# Patient Record
Sex: Female | Born: 1935 | Race: White | Hispanic: No | Marital: Married
Health system: Midwestern US, Community
[De-identification: ages and names within clinical notes are randomized; demographics above are authoritative.]

## PROBLEM LIST (undated history)

## (undated) DIAGNOSIS — M3322 Polymyositis with myopathy: Secondary | ICD-10-CM

## (undated) HISTORY — PX: THROAT SURGERY: SHX803

## (undated) HISTORY — PX: EYE SURGERY: SHX253

---

## 2014-06-26 ENCOUNTER — Inpatient Hospital Stay (HOSPITAL_COMMUNITY)
Admission: EM | Admit: 2014-06-26 | Discharge: 2014-06-30 | DRG: 480 | Disposition: A | Discharge status: Home Health | Payer: Medicare HMO | Attending: Internal Medicine | Admitting: Internal Medicine

## 2014-06-26 ENCOUNTER — Emergency Department (HOSPITAL_COMMUNITY): Payer: Medicare HMO

## 2014-06-26 ENCOUNTER — Encounter (HOSPITAL_COMMUNITY): Payer: Self-pay | Admitting: Cardiology

## 2014-06-26 DIAGNOSIS — E876 Hypokalemia: Secondary | ICD-10-CM | POA: Diagnosis not present

## 2014-06-26 DIAGNOSIS — Y9259 Other trade areas as the place of occurrence of the external cause: Secondary | ICD-10-CM | POA: Diagnosis not present

## 2014-06-26 DIAGNOSIS — Z7982 Long term (current) use of aspirin: Secondary | ICD-10-CM | POA: Diagnosis not present

## 2014-06-26 DIAGNOSIS — S72141A Displaced intertrochanteric fracture of right femur, initial encounter for closed fracture: Secondary | ICD-10-CM | POA: Diagnosis present

## 2014-06-26 DIAGNOSIS — W1809XA Striking against other object with subsequent fall, initial encounter: Secondary | ICD-10-CM | POA: Diagnosis present

## 2014-06-26 DIAGNOSIS — S0001XA Abrasion of scalp, initial encounter: Secondary | ICD-10-CM | POA: Diagnosis present

## 2014-06-26 DIAGNOSIS — E43 Unspecified severe protein-calorie malnutrition: Secondary | ICD-10-CM | POA: Diagnosis present

## 2014-06-26 DIAGNOSIS — S72001A Fracture of unspecified part of neck of right femur, initial encounter for closed fracture: Secondary | ICD-10-CM

## 2014-06-26 DIAGNOSIS — R52 Pain, unspecified: Secondary | ICD-10-CM

## 2014-06-26 DIAGNOSIS — Z419 Encounter for procedure for purposes other than remedying health state, unspecified: Secondary | ICD-10-CM

## 2014-06-26 DIAGNOSIS — M3320 Polymyositis, organ involvement unspecified: Secondary | ICD-10-CM | POA: Diagnosis present

## 2014-06-26 DIAGNOSIS — W19XXXA Unspecified fall, initial encounter: Secondary | ICD-10-CM

## 2014-06-26 DIAGNOSIS — Z681 Body mass index (BMI) 19 or less, adult: Secondary | ICD-10-CM | POA: Diagnosis not present

## 2014-06-26 DIAGNOSIS — M25551 Pain in right hip: Secondary | ICD-10-CM | POA: Diagnosis present

## 2014-06-26 HISTORY — DX: Polymyositis with myopathy: M33.22

## 2014-06-26 LAB — CBC WITH DIFFERENTIAL/PLATELET
BASOS PCT: 1 % (ref 0–1)
Basophils Absolute: 0 10*3/uL (ref 0.0–0.1)
EOS ABS: 0 10*3/uL (ref 0.0–0.7)
EOS PCT: 1 % (ref 0–5)
HEMATOCRIT: 39.5 % (ref 36.0–46.0)
HEMOGLOBIN: 13.4 g/dL (ref 12.0–15.0)
LYMPHS ABS: 0.7 10*3/uL (ref 0.7–4.0)
Lymphocytes Relative: 16 % (ref 12–46)
MCH: 32.1 pg (ref 26.0–34.0)
MCHC: 33.9 g/dL (ref 30.0–36.0)
MCV: 94.5 fL (ref 78.0–100.0)
MONO ABS: 0.3 10*3/uL (ref 0.1–1.0)
MONOS PCT: 6 % (ref 3–12)
Neutro Abs: 3.4 10*3/uL (ref 1.7–7.7)
Neutrophils Relative %: 76 % (ref 43–77)
Platelets: ADEQUATE 10*3/uL (ref 150–400)
RBC: 4.18 MIL/uL (ref 3.87–5.11)
RDW: 12.9 % (ref 11.5–15.5)
WBC: 4.4 10*3/uL (ref 4.0–10.5)

## 2014-06-26 LAB — BASIC METABOLIC PANEL
Anion gap: 14 (ref 5–15)
BUN: 12 mg/dL (ref 6–23)
CHLORIDE: 102 meq/L (ref 96–112)
CO2: 28 mEq/L (ref 19–32)
CREATININE: 0.36 mg/dL — AB (ref 0.50–1.10)
Calcium: 9.3 mg/dL (ref 8.4–10.5)
GFR calc Af Amer: >90 mL/min (ref >90)
GFR calc non Af Amer: >90 mL/min (ref >90)
GLUCOSE: 86 mg/dL (ref 70–99)
POTASSIUM: 3.6 meq/L — AB (ref 3.7–5.3)
Sodium: 144 mEq/L (ref 137–147)

## 2014-06-26 LAB — SURGICAL PCR SCREEN
MRSA, PCR: NEGATIVE
Staphylococcus aureus: NEGATIVE

## 2014-06-26 LAB — PROTIME-INR
INR: 1.18 (ref 0.00–1.49)
Prothrombin Time: 15.1 seconds (ref 11.6–15.2)

## 2014-06-26 MED ORDER — ACETAMINOPHEN 325 MG PO TABS: 650.0000 mg | ORAL_TABLET | Freq: Four times a day (QID) | ORAL | Status: DC | PRN

## 2014-06-26 MED ORDER — ACETAMINOPHEN 500 MG PO TABS
1000.0000 mg | ORAL_TABLET | Freq: Once | ORAL | Status: AC
  Administered 2014-06-26: 1000 mg via ORAL
  Filled 2014-06-26: qty 2

## 2014-06-26 MED ORDER — ALUM & MAG HYDROXIDE-SIMETH 200-200-20 MG/5ML PO SUSP: 30.0000 mL | Freq: Four times a day (QID) | ORAL | Status: DC | PRN

## 2014-06-26 MED ORDER — POTASSIUM CHLORIDE CRYS ER 20 MEQ PO TBCR
40.0000 meq | EXTENDED_RELEASE_TABLET | Freq: Once | ORAL | Status: DC
  Filled 2014-06-26: qty 2

## 2014-06-26 MED ORDER — HYDROMORPHONE HCL 1 MG/ML IJ SOLN
0.5000 mg | INTRAMUSCULAR | Status: DC | PRN
  Administered 2014-06-26: 0.5 mg via INTRAVENOUS
  Filled 2014-06-26: qty 1

## 2014-06-26 MED ORDER — DOCUSATE SODIUM 100 MG PO CAPS: 100.0000 mg | ORAL_CAPSULE | Freq: Every day | ORAL | Status: DC | PRN

## 2014-06-26 MED ORDER — ZOLPIDEM TARTRATE 5 MG PO TABS: 5.0000 mg | ORAL_TABLET | Freq: Every evening | ORAL | Status: DC | PRN

## 2014-06-26 MED ORDER — FENTANYL CITRATE 0.05 MG/ML IJ SOLN: 50.0000 ug | INTRAMUSCULAR | Status: DC | PRN

## 2014-06-26 MED ORDER — ENSURE COMPLETE PO LIQD: 237.0000 mL | Freq: Two times a day (BID) | ORAL | Status: DC

## 2014-06-26 MED ORDER — ACETAMINOPHEN 650 MG RE SUPP
650.0000 mg | Freq: Four times a day (QID) | RECTAL | Status: DC | PRN
Start: 2014-06-26 — End: 2014-06-27

## 2014-06-26 NOTE — ED Provider Notes (Signed)
CSN: [id number]     Arrival date & time 06/26/14  1134 History  This chart was scribe for Lyanne CoKevin M Mavis Fichera, MD by Angelene GiovanniEmmanuella Mensah, ED Scribe. The patient was seen in room APA03/APA03 and the patient's care was started at 1:31 PM.    Chief Complaint  Patient presents with  . Fall   The history is provided by the patient. No language interpreter was used.   HPI Comments: Meghan Mueller is a 79 y.o. female who presents to the Emergency Department status post fall that occurred when she arrived at her hotel PTA today. She reports that as she was going through an automatic door that closed in on her causing her to fall to the side hitting the back of her head. She reports associated right hip pain. She denies LOC, neck pain, or generalized weakness. She also denies being on blood thinners.   Past Medical History  Diagnosis Date  . Polymyositis with myopathy    Past Surgical History  Procedure Laterality Date  . Eye surgery    . Throat surgery     History reviewed. No pertinent family history. History  Substance Use Topics  . Smoking status: Never Smoker   . Smokeless tobacco: Not on file  . Alcohol Use: Yes     Comment: wine occasional    OB History    No data available     Review of Systems  Musculoskeletal: Positive for arthralgias (right hip). Negative for neck pain.  Neurological: Negative for weakness.  All other systems reviewed and are negative.     Allergies  Penicillins  Home Medications   Prior to Admission medications   Medication Sig Start Date End Date Taking? Authorizing Provider  aspirin EC 81 MG tablet Take 81 mg by mouth daily.   Yes Historical Provider, MD  Cholecalciferol (VITAMIN D-3 PO) Take by mouth.   Yes Historical Provider, MD  Multiple Vitamins-Minerals (HAIR/SKIN/NAILS PO) Take 1 tablet by mouth daily.   Yes Historical Provider, MD   There were no vitals taken for this visit. Physical Exam  Constitutional: She is oriented to person,  place, and time. She appears well-developed and well-nourished. No distress.  HENT:  Head: Normocephalic and atraumatic.  Eyes: Conjunctivae and EOM are normal.  Neck: Neck supple. No tracheal deviation present.  Cardiovascular: Normal rate.   Pulmonary/Chest: Effort normal. No respiratory distress.  Musculoskeletal: Normal range of motion.  No C-spine tenderness. Strength in hands. Normal ROM of right leg. Mild pain with internal rotation.   Neurological: She is alert and oriented to person, place, and time.  Skin: Skin is warm and dry.  Small abrasion, small hematoma on right posterior parietal scalp.   Psychiatric: She has a normal mood and affect. Her behavior is normal.  Nursing note and vitals reviewed.   ED Course  Procedures (including critical care time) DIAGNOSTIC STUDIES:    COORDINATION OF CARE: 1:40 PM- Pt advised of plan for treatment and pt agrees.    Labs Review Labs Reviewed  BASIC METABOLIC PANEL - Abnormal; Notable for the following:    Potassium 3.6 (*)    Creatinine, Ser 0.36 (*)    All other components within normal limits  CBC WITH DIFFERENTIAL  PROTIME-INR    Imaging Review Dg Chest 1 View  06/26/2014   CLINICAL DATA:  Preoperative evaluation for right hip fracture repair  EXAM: CHEST - 1 VIEW  COMPARISON:  None.  FINDINGS: Cardiac shadow is within normal limits. The thoracic aorta is  tortuous with mild calcifications. The lungs are well-aerated without focal infiltrate. No bony abnormality is seen.  IMPRESSION: No acute abnormality noted.   Electronically Signed   By: Alcide CleverMark  Lukens M.D.   On: 06/26/2014 15:07   Dg Hip Complete Right  06/26/2014   CLINICAL DATA:  fell at a hotel prior to arrival to ED today. Pt c/o Rt hip pain at lateral aspect. Hx hysterectomy  EXAM: RIGHT HIP - COMPLETE 2+ VIEW  COMPARISON:  None.  FINDINGS: There is a nondisplaced basicervical fracture of the right femur, probably also involving the greater trochanter. No  dislocation. No significant osseous degenerative change. Surgical sutures project over the right inguinal region and left lower quadrant.  IMPRESSION: 1. Right basicervical hip fracture, nondisplaced.   Electronically Signed   By: Oley Balmaniel  Hassell M.D.   On: 06/26/2014 14:27     EKG Interpretation   Date/Time:  Saturday June 26 2014 15:25:45 EST Ventricular Rate:  62 PR Interval:  181 QRS Duration: 97 QT Interval:  439 QTC Calculation: 446 R Axis:   -55 Text Interpretation:  Sinus rhythm LAD, consider left anterior fascicular  block Anterior infarct, old No old tracing to compare Confirmed by Jeromiah Ohalloran   MD, Caryn BeeKEVIN ([id number]) on 06/26/2014 4:06:09 PM      MDM   Final diagnoses:  Fall  Pain  Closed right hip fracture, initial encounter     Mechanical fall. Right hip fracture. Minor head injury. No laceration. Nothing to repair. No use of anticoagulants. No LOC. No indication for imaging of head. C spine nontender  Ortho: Dr Magnus IvanBlackman [facility]  I personally performed the services described in this documentation, which was scribed in my presence. The recorded information has been reviewed and is accurate.    Lyanne CoKevin M Latria Mccarron, MD 06/26/14 [phone]

## 2014-06-26 NOTE — Progress Notes (Signed)
[phone]. Meghan Mueller, [employer]

## 2014-06-26 NOTE — ED Notes (Signed)
Fall prior to arrival at hotel.  Doors caught her while closing.  C/o pain to right hip and also hit head .  Hematoma and laceration to right side of head.

## 2014-06-26 NOTE — Consult Note (Signed)
Reason for Consult:  Right hip fracture Referring Physician:   [place] is an 79 y.o. female.  HPI:   78 yo female who has a history of polymyalgias/polymyositis who ambulates with a walker.  Sustained an accidental mechanical fall earlier today and was seen at an outside hospital.  She was found to have a right hip fracture and was transferred down to [facility] for definitive treatment.  She only reports right hip pain.  Past Medical History  Diagnosis Date  . Polymyositis with myopathy     Past Surgical History  Procedure Laterality Date  . Eye surgery    . Throat surgery      History reviewed. No pertinent family history.  Social History:  reports that she has never smoked. She does not have any smokeless tobacco history on file. She reports that she drinks alcohol. She reports that she does not use illicit drugs.  Allergies:  Allergies  Allergen Reactions  . Penicillins Other (See Comments)    unknown    Medications: I have reviewed the patient's current medications.  Results for orders placed or performed during the hospital encounter of 06/26/14 (from the past 48 hour(s))  Basic metabolic panel     Status: Abnormal   Collection Time: 06/26/14  2:47 PM  Result Value Ref Range   Sodium 144 137 - 147 mEq/L   Potassium 3.6 (L) 3.7 - 5.3 mEq/L   Chloride 102 96 - 112 mEq/L   CO2 28 19 - 32 mEq/L   Glucose, Bld 86 70 - 99 mg/dL   BUN 12 6 - 23 mg/dL   Creatinine, Ser 0.36 (L) 0.50 - 1.10 mg/dL   Calcium 9.3 8.4 - 10.5 mg/dL   GFR calc non Af Amer >90 >90 mL/min   GFR calc Af Amer >90 >90 mL/min    Comment: (NOTE) The eGFR has been calculated using the CKD EPI equation. This calculation has not been validated in all clinical situations. eGFR's persistently <90 mL/min signify possible Chronic Kidney Disease.    Anion gap 14 5 - 15  CBC WITH DIFFERENTIAL     Status: None   Collection Time: 06/26/14  2:47 PM  Result Value Ref  Range   WBC 4.4 4.0 - 10.5 K/uL   RBC 4.18 3.87 - 5.11 MIL/uL   Hemoglobin 13.4 12.0 - 15.0 g/dL   HCT 39.5 36.0 - 46.0 %   MCV 94.5 78.0 - 100.0 fL   MCH 32.1 26.0 - 34.0 pg   MCHC 33.9 30.0 - 36.0 g/dL   RDW 12.9 11.5 - 15.5 %   Platelets  150 - 400 K/uL    PLATELET CLUMPS NOTED ON SMEAR, COUNT APPEARS ADEQUATE   Neutrophils Relative % 76 43 - 77 %   Neutro Abs 3.4 1.7 - 7.7 K/uL   Lymphocytes Relative 16 12 - 46 %   Lymphs Abs 0.7 0.7 - 4.0 K/uL   Monocytes Relative 6 3 - 12 %   Monocytes Absolute 0.3 0.1 - 1.0 K/uL   Eosinophils Relative 1 0 - 5 %   Eosinophils Absolute 0.0 0.0 - 0.7 K/uL   Basophils Relative 1 0 - 1 %   Basophils Absolute 0.0 0.0 - 0.1 K/uL  Protime-INR     Status: None   Collection Time: 06/26/14  2:47 PM  Result Value Ref Range   Prothrombin Time 15.1 11.6 - 15.2 seconds   INR 1.18 0.00 - 1.49  Dg Chest 1 View  06/26/2014   CLINICAL DATA:  Preoperative evaluation for right hip fracture repair  EXAM: CHEST - 1 VIEW  COMPARISON:  None.  FINDINGS: Cardiac shadow is within normal limits. The thoracic aorta is tortuous with mild calcifications. The lungs are well-aerated without focal infiltrate. No bony abnormality is seen.  IMPRESSION: No acute abnormality noted.   Electronically Signed   By: Inez Catalina M.D.   On: 06/26/2014 15:07   Dg Hip Complete Right  06/26/2014   CLINICAL DATA:  fell at a hotel prior to arrival to ED today. Pt c/o Rt hip pain at lateral aspect. Hx hysterectomy  EXAM: RIGHT HIP - COMPLETE 2+ VIEW  COMPARISON:  None.  FINDINGS: There is a nondisplaced basicervical fracture of the right femur, probably also involving the greater trochanter. No dislocation. No significant osseous degenerative change. Surgical sutures project over the right inguinal region and left lower quadrant.  IMPRESSION: 1. Right basicervical hip fracture, nondisplaced.   Electronically Signed   By: Arne Cleveland M.D.   On: 06/26/2014 14:27    Review of Systems   Musculoskeletal: Positive for myalgias and joint pain.  All other systems reviewed and are negative.  Blood pressure 116/87, pulse 62, temperature 97.5 F (36.4 C), temperature source Oral, resp. rate 18, SpO2 98 %. Physical Exam  Musculoskeletal:       Right hip: She exhibits decreased range of motion, decreased strength, tenderness and bony tenderness.  Her leg lengths are equal. Her right foot has normal sensation and is well-perfused  Assessment/Plan: Right hip fracture (high intertroch vs low basicervical with minimal displacement) 1)  I spoke to her in length about her situation.  Will proceed to the OR in the am for open reduction/internal fixation of her right hip; then will hopefully get her up with therapy by the afternoon with full weight bearing as tolerated.  The risks and benefits of surgery were fully explained and understood.  Mcarthur Rossetti 06/26/2014, 8:32 PM

## 2014-06-26 NOTE — H&P (Signed)
History and Physical  Meghan Mueller ZOX:[id number] DOB: 04/20/1935 DOA: 06/26/2014  Referring physician: Dr. Patria Maneampos, ED physician PCP: No primary care provider on file.   Chief Complaint: Fall  HPI: Meghan Mueller is a 79 y.o. female  With a history of bilateral leg weakness due [place] who presents to the hospital due to a fall at the hotel where she was staying. Patient was walking with her walker through the automatic doors. The door started to close, knocking her to the ground. She fell on her right hip and struck the right side of her head. The patient was able to ablate afterwards but presented to the hospital for evaluation. X-ray of her right hip shows a nondisplaced closed fracture. Pain is mild at rest and well controlled. She did have an abrasion on her right scalp that was bleeding but is now hemostatic.  Due to the polymyositis, the patient ambulates with a walker and uses a scooter for mobility. Patient denies cardiac history.  Review of Systems:   Pt denies any fevers, chills, nausea, vomiting, chest pain, shortness of breath, wheezing, cough, palpitations, headache, blurred vision, unilateral weakness, abdominal pain, diarrhea, constipation.  Review of systems are otherwise negative  Past Medical History  Diagnosis Date  . Polymyositis with myopathy    Past Surgical History  Procedure Laterality Date  . Eye surgery    . Throat surgery     Social History:  reports that she has never smoked. She does not have any smokeless tobacco history on file. She reports that she drinks alcohol. She reports that she does not use illicit drugs. Patient lives at home & is able to participate in activities of daily living with assistance.  Allergies  Allergen Reactions  . Penicillins Other (See Comments)    unknown    History reviewed. No pertinent family history.    Prior to Admission medications   Medication Sig Start Date End Date Taking?  Authorizing Provider  aspirin EC 81 MG tablet Take 81 mg by mouth daily.   Yes Historical Provider, MD  Cholecalciferol (VITAMIN D-3 PO) Take by mouth.   Yes Historical Provider, MD  Multiple Vitamins-Minerals (HAIR/SKIN/NAILS PO) Take 1 tablet by mouth daily.   Yes Historical Provider, MD    Physical Exam: BP 135/65 mmHg  Pulse 80  Resp 16  SpO2 99%  General: Elderly Caucasian female. Awake and alert and oriented x3. No acute cardiopulmonary distress.  Eyes: Pupils equal, round, reactive to light. Extraocular muscles are intact. Sclerae anicteric and noninjected.  ENT:  Moist mucosal membranes. No mucosal lesions.  Neck: Neck supple without lymphadenopathy. No carotid bruits. No masses palpated.  Cardiovascular: Regular rate with normal S1-S2 sounds. No murmurs, rubs, gallops auscultated. No JVD.  Respiratory: Good respiratory effort with no wheezes, rales, rhonchi. Lungs clear to auscultation bilaterally.  Abdomen: Soft, nontender, nondistended. Active bowel sounds. No masses or hepatosplenomegaly  Skin: Dry, warm to touch. 2+ dorsalis pedis and radial pulses. Musculoskeletal: No calf or leg pain. All major joints not erythematous nontender.  Psychiatric: Intact judgment and insight.  Neurologic: No focal neurological deficits. Cranial nerves II through XII are grossly intact.           Labs on Admission:  Basic Metabolic Panel:  Recent Labs Lab 06/26/14 1447  NA 144  K 3.6*  CL 102  CO2 28  GLUCOSE 86  BUN 12  CREATININE 0.36*  CALCIUM 9.3   Liver Function Tests: No results for input(s): AST,  ALT, ALKPHOS, BILITOT, PROT, ALBUMIN in the last 168 hours. No results for input(s): LIPASE, AMYLASE in the last 168 hours. No results for input(s): AMMONIA in the last 168 hours. CBC:  Recent Labs Lab 06/26/14 1447  WBC 4.4  NEUTROABS 3.4  HGB 13.4  HCT 39.5  MCV 94.5  PLT PLATELET CLUMPS NOTED ON SMEAR, COUNT APPEARS ADEQUATE   Cardiac Enzymes: No results for  input(s): CKTOTAL, CKMB, CKMBINDEX, TROPONINI in the last 168 hours.  BNP (last 3 results) No results for input(s): PROBNP in the last 8760 hours. CBG: No results for input(s): GLUCAP in the last 168 hours.  Radiological Exams on Admission: Dg Chest 1 View  06/26/2014   CLINICAL DATA:  Preoperative evaluation for right hip fracture repair  EXAM: CHEST - 1 VIEW  COMPARISON:  None.  FINDINGS: Cardiac shadow is within normal limits. The thoracic aorta is tortuous with mild calcifications. The lungs are well-aerated without focal infiltrate. No bony abnormality is seen.  IMPRESSION: No acute abnormality noted.   Electronically Signed   By: Alcide CleverMark  Lukens M.D.   On: 06/26/2014 15:07   Dg Hip Complete Right  06/26/2014   CLINICAL DATA:  fell at a hotel prior to arrival to ED today. Pt c/o Rt hip pain at lateral aspect. Hx hysterectomy  EXAM: RIGHT HIP - COMPLETE 2+ VIEW  COMPARISON:  None.  FINDINGS: There is a nondisplaced basicervical fracture of the right femur, probably also involving the greater trochanter. No dislocation. No significant osseous degenerative change. Surgical sutures project over the right inguinal region and left lower quadrant.  IMPRESSION: 1. Right basicervical hip fracture, nondisplaced.   Electronically Signed   By: Oley Balmaniel  Hassell M.D.   On: 06/26/2014 14:27    EKG: Independently reviewed.  Sinus rhythm at 62 bpm. PR interval and QRS intervals are normal. There are Q waves in V1 and V2, suggestive of old anterior infarct  Assessment/Plan Present on Admission:  . Closed right hip fracture  #1 closed right hip fracture We'll transfer the patient to [facility]. Dr. Magnus IvanBlackman of orthopedics was consulted by the ER physician and will consult on the patient tomorrow for surgery. We'll keep the patient nothing by mouth after midnight. We'll keep the patient comfortable. Patient does not currently have any active cardiac problems and is cleared medically for surgery.  #2  polymyositis Will need ambulation with walker.  DVT prophylaxis: SCDs - will hold medical prophylaxis as the patient will be undergoing surgery tomorrow  Consultants: Orthopedics  Code Status: Full  Family Communication: Husband in the room   Disposition Plan: Pending surgery  Time spent: 50 minutes  Candelaria CelesteJacob Jalisia Puchalski, DO [facility] Pager [phone]

## 2014-06-27 ENCOUNTER — Inpatient Hospital Stay (HOSPITAL_COMMUNITY): Payer: Medicare HMO | Admitting: Anesthesiology

## 2014-06-27 ENCOUNTER — Encounter (HOSPITAL_COMMUNITY): Payer: Self-pay | Admitting: Anesthesiology

## 2014-06-27 ENCOUNTER — Inpatient Hospital Stay (HOSPITAL_COMMUNITY): Payer: Medicare HMO

## 2014-06-27 ENCOUNTER — Encounter (HOSPITAL_COMMUNITY)
Admission: EM | Disposition: A | Discharge status: Home Health | Payer: Self-pay | Source: Home / Self Care | Attending: Internal Medicine

## 2014-06-27 HISTORY — PX: INTRAMEDULLARY (IM) NAIL INTERTROCHANTERIC: SHX5875

## 2014-06-27 LAB — CBC
HEMATOCRIT: 40.1 % (ref 36.0–46.0)
HEMOGLOBIN: 13.5 g/dL (ref 12.0–15.0)
MCH: 32.1 pg (ref 26.0–34.0)
MCHC: 33.7 g/dL (ref 30.0–36.0)
MCV: 95.5 fL (ref 78.0–100.0)
Platelets: 122 10*3/uL — ABNORMAL LOW (ref 150–400)
RBC: 4.2 MIL/uL (ref 3.87–5.11)
RDW: 12.9 % (ref 11.5–15.5)
WBC: 3.2 10*3/uL — ABNORMAL LOW (ref 4.0–10.5)

## 2014-06-27 LAB — BASIC METABOLIC PANEL
Anion gap: 13 (ref 5–15)
BUN: 12 mg/dL (ref 6–23)
CALCIUM: 9.2 mg/dL (ref 8.4–10.5)
CHLORIDE: 98 meq/L (ref 96–112)
CO2: 28 meq/L (ref 19–32)
CREATININE: 0.3 mg/dL — AB (ref 0.50–1.10)
GFR calc Af Amer: >90 mL/min (ref >90)
GFR calc non Af Amer: >90 mL/min (ref >90)
GLUCOSE: 79 mg/dL (ref 70–99)
Potassium: 3.4 mEq/L — ABNORMAL LOW (ref 3.7–5.3)
Sodium: 139 mEq/L (ref 137–147)

## 2014-06-27 SURGERY — FIXATION, FRACTURE, INTERTROCHANTERIC, WITH INTRAMEDULLARY ROD
Anesthesia: General | Site: Hip | Laterality: Right

## 2014-06-27 MED ORDER — SODIUM CHLORIDE 0.9 % IV SOLN
INTRAVENOUS | Status: DC
  Administered 2014-06-27: 09:00:00 via INTRAVENOUS

## 2014-06-27 MED ORDER — ACETAMINOPHEN 650 MG RE SUPP: 650.0000 mg | Freq: Four times a day (QID) | RECTAL | Status: DC | PRN

## 2014-06-27 MED ORDER — HYDROCODONE-ACETAMINOPHEN 5-325 MG PO TABS
1.0000 | ORAL_TABLET | Freq: Four times a day (QID) | ORAL | Status: DC | PRN
  Administered 2014-06-27 – 2014-06-30 (×8): 1 via ORAL
  Filled 2014-06-27 (×8): qty 1

## 2014-06-27 MED ORDER — HYDROMORPHONE HCL 1 MG/ML IJ SOLN: 0.1000 mg | INTRAMUSCULAR | Status: DC | PRN

## 2014-06-27 MED ORDER — MENTHOL 3 MG MT LOZG: 1.0000 | LOZENGE | OROMUCOSAL | Status: DC | PRN

## 2014-06-27 MED ORDER — LIDOCAINE HCL (CARDIAC) 20 MG/ML IV SOLN
INTRAVENOUS | Status: DC | PRN
  Administered 2014-06-27: 50 mg via INTRAVENOUS

## 2014-06-27 MED ORDER — LACTATED RINGERS IV SOLN
INTRAVENOUS | Status: DC | PRN
  Administered 2014-06-27: 07:00:00 via INTRAVENOUS

## 2014-06-27 MED ORDER — DEXAMETHASONE SODIUM PHOSPHATE 10 MG/ML IJ SOLN
INTRAMUSCULAR | Status: DC | PRN
  Administered 2014-06-27: 10 mg via INTRAVENOUS

## 2014-06-27 MED ORDER — PROPOFOL 10 MG/ML IV BOLUS
INTRAVENOUS | Status: DC | PRN
  Administered 2014-06-27: 110 mg via INTRAVENOUS

## 2014-06-27 MED ORDER — SODIUM CHLORIDE 0.9 % IV SOLN
INTRAVENOUS | Status: DC
  Administered 2014-06-27: 1000 mL via INTRAVENOUS

## 2014-06-27 MED ORDER — MORPHINE SULFATE 2 MG/ML IJ SOLN: 0.5000 mg | INTRAMUSCULAR | Status: DC | PRN

## 2014-06-27 MED ORDER — ONDANSETRON HCL 4 MG PO TABS: 4.0000 mg | ORAL_TABLET | Freq: Four times a day (QID) | ORAL | Status: DC | PRN

## 2014-06-27 MED ORDER — POTASSIUM CHLORIDE CRYS ER 20 MEQ PO TBCR
40.0000 meq | EXTENDED_RELEASE_TABLET | Freq: Once | ORAL | Status: AC
  Administered 2014-06-27: 40 meq via ORAL
  Filled 2014-06-27: qty 2

## 2014-06-27 MED ORDER — DEXAMETHASONE SODIUM PHOSPHATE 10 MG/ML IJ SOLN
INTRAMUSCULAR | Status: AC
  Filled 2014-06-27: qty 1

## 2014-06-27 MED ORDER — ONDANSETRON HCL 4 MG/2ML IJ SOLN
INTRAMUSCULAR | Status: DC | PRN
  Administered 2014-06-27: 4 mg via INTRAVENOUS

## 2014-06-27 MED ORDER — CEFAZOLIN SODIUM-DEXTROSE 2-3 GM-% IV SOLR
2.0000 g | Freq: Four times a day (QID) | INTRAVENOUS | Status: AC
  Administered 2014-06-27 (×2): 2 g via INTRAVENOUS
  Filled 2014-06-27 (×2): qty 50

## 2014-06-27 MED ORDER — LIP MEDEX EX OINT
TOPICAL_OINTMENT | CUTANEOUS | Status: AC
  Administered 2014-06-27: 13:00:00
  Filled 2014-06-27: qty 7

## 2014-06-27 MED ORDER — METOCLOPRAMIDE HCL 5 MG/ML IJ SOLN
5.0000 mg | Freq: Three times a day (TID) | INTRAMUSCULAR | Status: DC | PRN
Start: 2014-06-27 — End: 2014-06-30

## 2014-06-27 MED ORDER — 0.9 % SODIUM CHLORIDE (POUR BTL) OPTIME
TOPICAL | Status: DC | PRN
  Administered 2014-06-27: 1000 mL

## 2014-06-27 MED ORDER — CEFAZOLIN SODIUM-DEXTROSE 2-3 GM-% IV SOLR
2.0000 g | Freq: Once | INTRAVENOUS | Status: AC
  Administered 2014-06-27: 2 g via INTRAVENOUS

## 2014-06-27 MED ORDER — ROCURONIUM BROMIDE 100 MG/10ML IV SOLN
INTRAVENOUS | Status: DC | PRN
  Administered 2014-06-27: 30 mg via INTRAVENOUS

## 2014-06-27 MED ORDER — OXYCODONE HCL 5 MG PO TABS: 5.0000 mg | ORAL_TABLET | ORAL | Status: DC | PRN

## 2014-06-27 MED ORDER — ALUM & MAG HYDROXIDE-SIMETH 200-200-20 MG/5ML PO SUSP: 30.0000 mL | ORAL | Status: DC | PRN

## 2014-06-27 MED ORDER — EPHEDRINE SULFATE 50 MG/ML IJ SOLN
INTRAMUSCULAR | Status: DC | PRN
  Administered 2014-06-27 (×3): 5 mg via INTRAVENOUS

## 2014-06-27 MED ORDER — METHOCARBAMOL 500 MG PO TABS
500.0000 mg | ORAL_TABLET | Freq: Four times a day (QID) | ORAL | Status: DC | PRN
  Administered 2014-06-27 – 2014-06-28 (×2): 500 mg via ORAL
  Filled 2014-06-27 (×3): qty 1

## 2014-06-27 MED ORDER — FENTANYL CITRATE 0.05 MG/ML IJ SOLN
25.0000 ug | INTRAMUSCULAR | Status: DC | PRN
  Administered 2014-06-27 (×2): 25 ug via INTRAVENOUS
  Administered 2014-06-27: 50 ug via INTRAVENOUS

## 2014-06-27 MED ORDER — METOCLOPRAMIDE HCL 10 MG PO TABS: 5.0000 mg | ORAL_TABLET | Freq: Three times a day (TID) | ORAL | Status: DC | PRN

## 2014-06-27 MED ORDER — FENTANYL CITRATE 0.05 MG/ML IJ SOLN
INTRAMUSCULAR | Status: AC
  Filled 2014-06-27: qty 5

## 2014-06-27 MED ORDER — LIDOCAINE HCL (CARDIAC) 20 MG/ML IV SOLN
INTRAVENOUS | Status: AC
  Filled 2014-06-27: qty 5

## 2014-06-27 MED ORDER — NEOSTIGMINE METHYLSULFATE 10 MG/10ML IV SOLN
INTRAVENOUS | Status: DC | PRN
  Administered 2014-06-27: 2.5 mg via INTRAVENOUS

## 2014-06-27 MED ORDER — ACETAMINOPHEN 325 MG PO TABS: 650.0000 mg | ORAL_TABLET | Freq: Four times a day (QID) | ORAL | Status: DC | PRN

## 2014-06-27 MED ORDER — SODIUM CHLORIDE 0.9 % IJ SOLN
INTRAMUSCULAR | Status: AC
  Filled 2014-06-27: qty 10

## 2014-06-27 MED ORDER — ASPIRIN EC 325 MG PO TBEC
325.0000 mg | DELAYED_RELEASE_TABLET | Freq: Every day | ORAL | Status: DC
  Administered 2014-06-28 – 2014-06-29 (×2): 325 mg via ORAL
  Filled 2014-06-27 (×4): qty 1

## 2014-06-27 MED ORDER — ATROPINE SULFATE 0.4 MG/ML IJ SOLN
INTRAMUSCULAR | Status: AC
  Filled 2014-06-27: qty 2

## 2014-06-27 MED ORDER — EPHEDRINE SULFATE 50 MG/ML IJ SOLN
INTRAMUSCULAR | Status: AC
  Filled 2014-06-27: qty 1

## 2014-06-27 MED ORDER — ONDANSETRON HCL 4 MG/2ML IJ SOLN
INTRAMUSCULAR | Status: AC
  Filled 2014-06-27: qty 2

## 2014-06-27 MED ORDER — DEXTROSE 5 % IV SOLN
500.0000 mg | Freq: Four times a day (QID) | INTRAVENOUS | Status: DC | PRN
  Administered 2014-06-27: 500 mg via INTRAVENOUS
  Filled 2014-06-27 (×2): qty 5

## 2014-06-27 MED ORDER — PHENOL 1.4 % MT LIQD: 1.0000 | OROMUCOSAL | Status: DC | PRN

## 2014-06-27 MED ORDER — FENTANYL CITRATE 0.05 MG/ML IJ SOLN
INTRAMUSCULAR | Status: AC
  Filled 2014-06-27: qty 2

## 2014-06-27 MED ORDER — PROPOFOL 10 MG/ML IV BOLUS
INTRAVENOUS | Status: AC
  Filled 2014-06-27: qty 20

## 2014-06-27 MED ORDER — FENTANYL CITRATE 0.05 MG/ML IJ SOLN
INTRAMUSCULAR | Status: DC | PRN
  Administered 2014-06-27 (×2): 50 ug via INTRAVENOUS

## 2014-06-27 MED ORDER — CEFAZOLIN SODIUM-DEXTROSE 2-3 GM-% IV SOLR
INTRAVENOUS | Status: AC
  Filled 2014-06-27: qty 50

## 2014-06-27 MED ORDER — GLYCOPYRROLATE 0.2 MG/ML IJ SOLN
INTRAMUSCULAR | Status: DC | PRN
  Administered 2014-06-27: .3 mg via INTRAVENOUS

## 2014-06-27 MED ORDER — ONDANSETRON HCL 4 MG/2ML IJ SOLN: 4.0000 mg | Freq: Four times a day (QID) | INTRAMUSCULAR | Status: DC | PRN

## 2014-06-27 SURGICAL SUPPLY — 44 items
BENZOIN TINCTURE PRP APPL 2/3 (GAUZE/BANDAGES/DRESSINGS) ×3 IMPLANT
BLADE SURG 15 STRL LF DISP TIS (BLADE) IMPLANT
BLADE SURG 15 STRL SS (BLADE)
BNDG GAUZE ELAST 4 BULKY (GAUZE/BANDAGES/DRESSINGS) ×3 IMPLANT
CLOSURE STERI-STRIP 1/4X4 (GAUZE/BANDAGES/DRESSINGS) ×3 IMPLANT
COVER PERINEAL POST (MISCELLANEOUS) ×3 IMPLANT
COVER SURGICAL LIGHT HANDLE (MISCELLANEOUS) IMPLANT
DRAPE PROXIMA HALF (DRAPES) IMPLANT
DRAPE STERI IOBAN 125X83 (DRAPES) ×3 IMPLANT
DRSG MEPILEX BORDER 4X4 (GAUZE/BANDAGES/DRESSINGS) ×6 IMPLANT
DRSG MEPILEX BORDER 4X8 (GAUZE/BANDAGES/DRESSINGS) IMPLANT
DRSG PAD ABDOMINAL 8X10 ST (GAUZE/BANDAGES/DRESSINGS) IMPLANT
DURAPREP 26ML APPLICATOR (WOUND CARE) ×3 IMPLANT
ELECT REM PT RETURN 9FT ADLT (ELECTROSURGICAL) ×3
ELECTRODE REM PT RTRN 9FT ADLT (ELECTROSURGICAL) ×1 IMPLANT
FACESHIELD WRAPAROUND (MASK) IMPLANT
GAUZE XEROFORM 5X9 LF (GAUZE/BANDAGES/DRESSINGS) IMPLANT
GLOVE BIOGEL PI IND STRL 8 (GLOVE) ×2 IMPLANT
GLOVE BIOGEL PI INDICATOR 8 (GLOVE) ×4
GLOVE ECLIPSE 8.0 STRL XLNG CF (GLOVE) ×3 IMPLANT
GLOVE ORTHO TXT STRL SZ7.5 (GLOVE) ×3 IMPLANT
GOWN STRL REUS W/ TWL XL LVL3 (GOWN DISPOSABLE) ×1 IMPLANT
GOWN STRL REUS W/TWL LRG LVL3 (GOWN DISPOSABLE) ×3 IMPLANT
GOWN STRL REUS W/TWL XL LVL3 (GOWN DISPOSABLE) ×2
GUIDEPIN 3.2X17.5 THRD DISP (PIN) ×3 IMPLANT
HIP FRAC NAIL LAG SCR 10.5X100 (Orthopedic Implant) ×2 IMPLANT
KIT BASIN OR (CUSTOM PROCEDURE TRAY) ×3 IMPLANT
KIT ROOM TURNOVER OR (KITS) IMPLANT
MANIFOLD NEPTUNE II (INSTRUMENTS) IMPLANT
NAIL HIP FRACTURE 11X380MM (Nail) ×3 IMPLANT
NS IRRIG 1000ML POUR BTL (IV SOLUTION) ×3 IMPLANT
PACK GENERAL/GYN (CUSTOM PROCEDURE TRAY) ×3 IMPLANT
PAD ARMBOARD 7.5X6 YLW CONV (MISCELLANEOUS) IMPLANT
PAD CAST 4YDX4 CTTN HI CHSV (CAST SUPPLIES) IMPLANT
PADDING CAST COTTON 4X4 STRL (CAST SUPPLIES)
POSITIONER SURGICAL ARM (MISCELLANEOUS) ×6 IMPLANT
SCREW CANN THRD AFF 10.5X100 (Orthopedic Implant) ×1 IMPLANT
STAPLER VISISTAT 35W (STAPLE) IMPLANT
SUT VIC AB 0 CT1 27 (SUTURE) ×4
SUT VIC AB 0 CT1 27XBRD ANBCTR (SUTURE) ×2 IMPLANT
SUT VIC AB 2-0 CT1 27 (SUTURE) ×4
SUT VIC AB 2-0 CT1 TAPERPNT 27 (SUTURE) ×2 IMPLANT
TOWEL OR 17X24 6PK STRL BLUE (TOWEL DISPOSABLE) IMPLANT
TOWEL OR 17X26 10 PK STRL BLUE (TOWEL DISPOSABLE) ×3 IMPLANT

## 2014-06-27 NOTE — Progress Notes (Signed)
Patient ID: Meghan Mueller, female   DOB: September 25, 1934, 79 y.o.   MRN: [id number]  [facility] PROGRESS NOTE  Meghan Mueller [id number] DOB: September 25, 1934 DOA: 06/26/2014 PCP: No primary care provider on file.  Brief narrative: 79 yo female who has a history of polymyalgias/polymyositis, ambulates with a walker at baseline, [facility] of right hip fracture after an episode of fall.   Assessment and Plan:    Active Problems:   Closed right hip fracture - Right nondisplaced intertrochanteric versus low basicervical proximal femur fracture. - status post ORIF of right proximal femur fx, clinically stable post op - continue analgesia as needed - plan for PT once pt able to participate    Hypokalemia - supplement and repeat BMP in AM  DVT prophylaxis  SCD's, aspirin 325 mg PO QD  Code Status: Full Family Communication: Pt at bedside Disposition Plan: To be determined   IV Access:   Peripheral IV Procedures and diagnostic studies:    Dg Chest 1 View  06/26/2014   No acute abnormality noted.    Dg Hip Complete Right  06/26/2014  Right basicervical hip fracture, nondisplaced.    Medical Consultants:   Ortho Other Consultants:   Physical therapy  Anti-Infectives:   None   Debbora PrestoMAGICK-Gaines Cartmell, MD  [facility] Pager [phone]  If 7PM-7AM, please contact night-coverage [email or web address] Password [email or web address] 06/27/2014, 9:26 AM   LOS: 1 day   HPI/Subjective: No events overnight.   Objective: Filed Vitals:   06/27/14 0909 06/27/14 0910 06/27/14 0915 06/27/14 0920  BP:   126/52   Pulse:  54 48 58  Temp:      TempSrc:      Resp: 12 12 8 16   Height:      Weight:      SpO2:  100% 100% 100%    Intake/Output Summary (Last 24 hours) at 06/27/14 0926 Last data filed at 06/27/14 0915  Gross per 24 hour  Intake   1650 ml  Output    475 ml  Net   1175 ml    Exam:   General:  Pt is alert, follows commands appropriately, not in acute  distress  Cardiovascular: Regular rhythm, bradycardic, S1/S2, no rubs, no gallops  Respiratory: Clear to auscultation bilaterally, no wheezing, no crackles, no rhonchi  Abdomen: Soft, non tender, non distended, bowel sounds present, no guarding  Extremities: No edema, pulses DP and PT palpable bilaterally  Data Reviewed: Basic Metabolic Panel:  Recent Labs Lab 06/26/14 1447 06/27/14 0550  NA 144 139  K 3.6* 3.4*  CL 102 98  CO2 28 28  GLUCOSE 86 79  BUN 12 12  CREATININE 0.36* 0.30*  CALCIUM 9.3 9.2   CBC:  Recent Labs Lab 06/26/14 1447 06/27/14 0550  WBC 4.4 3.2*  NEUTROABS 3.4  --   HGB 13.4 13.5  HCT 39.5 40.1  MCV 94.5 95.5  PLT -  122*   Recent Results (from the past 240 hour(s))  Surgical pcr screen     Status: None   Collection Time: 06/26/14  8:32 PM  Result Value Ref Range Status   MRSA, PCR NEGATIVE NEGATIVE Final   Staphylococcus aureus NEGATIVE NEGATIVE Final     Scheduled Meds: . [MAR Hold] feeding supplement (ENSURE COMPLETE)  237 mL Oral BID BM  . fentaNYL      . [MAR Hold] potassium chloride  40 mEq Oral Once   Continuous Infusions: . sodium chloride 75 mL/hr at 06/27/14 0920

## 2014-06-27 NOTE — Progress Notes (Signed)
Patient ID: Meghan PacasMary Lynn Weltman, female   DOB: [birth year], 79 y.o.   MRN: [id number] Ms. Dangerfield understands fully that we will be proceeding to the OR this am for surgical fixation of her right hip fracture.  Informed consent is obtained.

## 2014-06-27 NOTE — Brief Op Note (Signed)
06/26/2014 - 06/27/2014  8:35 AM  PATIENT:  Meghan Mueller  79 y.o. female  PRE-OPERATIVE DIAGNOSIS:  Right hip intertrochanteric fracture  POST-OPERATIVE DIAGNOSIS:  Right hip intertrochanteric fracture  PROCEDURE:  Procedure(s): INTRAMEDULLARY (IM) NAIL INTERTROCHANTRIC (Right)  SURGEON:  Surgeon(s) and Role:    * Kathryne Hitchhristopher Y Tine Mabee, MD - Primary  ANESTHESIA:   general  EBL:  Total I/O In: 1000 [I.V.:1000] Out: 75 [Blood:75]  BLOOD ADMINISTERED:none  DRAINS: none   LOCAL MEDICATIONS USED:  NONE  SPECIMEN:  No Specimen  DISPOSITION OF SPECIMEN:  N/A  COUNTS:  YES  TOURNIQUET:  * No tourniquets in log *  DICTATION: .Other Dictation: Dictation Number [id number]  PLAN OF CARE: Admit to inpatient   PATIENT DISPOSITION:  PACU - hemodynamically stable.   Delay start of Pharmacological VTE agent (>24hrs) due to surgical blood loss or risk of bleeding: no

## 2014-06-27 NOTE — Anesthesia Preprocedure Evaluation (Addendum)
Anesthesia Evaluation  Patient identified by MRN, date of birth, ID band Patient awake    Reviewed: Allergy & Precautions, NPO status , Patient's Chart, lab work & pertinent test results  Airway Mallampati: II  TM Distance: >3 FB Neck ROM: Full    Dental  (+) Dental Advisory Given Crowns:   Pulmonary neg pulmonary ROS,  breath sounds clear to auscultation  Pulmonary exam normal       Cardiovascular Exercise Tolerance: Good negative cardio ROS  Rhythm:Regular Rate:Normal  ECG: SR, LAD, old anterior infarct.   Neuro/Psych polymyositis with myopathy.  Uses a walker.  Neuromuscular disease negative psych ROS   GI/Hepatic negative GI ROS, Neg liver ROS,   Endo/Other  negative endocrine ROS  Renal/GU negative Renal ROS  negative genitourinary   Musculoskeletal negative musculoskeletal ROS (+)   Abdominal   Peds negative pediatric ROS (+)  Hematology negative hematology ROS (+)   Anesthesia Other Findings   Reproductive/Obstetrics negative OB ROS                           Anesthesia Physical Anesthesia Plan  ASA: II  Anesthesia Plan: General   Post-op Pain Management:    Induction: Intravenous  Airway Management Planned: Oral ETT  Additional Equipment:   Intra-op Plan:   Post-operative Plan: Extubation in OR  Informed Consent: I have reviewed the patients History and Physical, chart, labs and discussed the procedure including the risks, benefits and alternatives for the proposed anesthesia with the patient or authorized representative who has indicated his/her understanding and acceptance.   Dental advisory given  Plan Discussed with: CRNA  Anesthesia Plan Comments: (Myopathy, avoid succinylcholine.)       Anesthesia Quick Evaluation

## 2014-06-27 NOTE — Op Note (Signed)
NAMMarland Kitchen:  Leda MinREYNOLDS, Linda               ACCOUNT NO.:  [id number]  MEDICAL RECORD NO.:  [id number]  LOCATION:  [facility]                         [facility]  PHYSICIAN:  Vanita PandaChristopher Y. Magnus IvanBlackman, M.D.DATE OF BIRTH:  08/14/1934  DATE OF PROCEDURE:  06/27/2014 DATE OF DISCHARGE:                              OPERATIVE REPORT   PREOPERATIVE DIAGNOSIS:  Right nondisplaced high intertrochanteric versus low basicervical proximal femur fracture.  POSTOPERATIVE DIAGNOSIS:  Right nondisplaced high intertrochanteric versus low basicervical proximal femur fracture.  PROCEDURE:  Open reduction and internal fixation of right proximal femur fracture using cephalomedullary nail and compression screw.  IMPLANTS:  Biomet 10 x 380 intramedullary nail with 100 mm compression screw.  SURGEON:  Vanita PandaChristopher Y. Magnus IvanBlackman, M.D.  ANESTHESIA:  General.  ANTIBIOTICS:  2 g of IV Ancef.  BLOOD LOSS:  Less than 100 mL.  COMPLICATIONS:  None.  INDICATIONS:  Meghan Mueller is a very pleasant 79 year old female with polymyositis who is down traveling from [place] to be with family.  She sustained a mechanical fall I believe at the hotel and was seen at an outlying hospital.  She was found to have essentially nondisplaced proximal femur fracture of the right hip.  The x-ray showed fracture lines what appeared to be either a high intertrochanteric versus a basicervical fracture.  This fracture is definitely amenable to fixation with a cephalomedullary nail implant.  I talked to her in detail about the reasoning behind surgery including the risk of acute blood loss anemia excluding the fracture, nerve and muscle injury, and infection as well as DVT.  She understands that the goals are decreased pain and improved mobility, and overall improved quality of life.  I have let her know with nonsurgical treatment she would need to be nonweightbearing for 6-8 weeks and with surgical treatment, we will get her up and putting  weight on it with assistance as soon as possible.  She is someone who does ambulate with a walker and we want to try to get her back to that level of care and mobility.  PROCEDURE DESCRIPTION:  After informed consent was obtained, appropriate right hip was marked.  She was brought to the operating room and general anesthesia was obtained while she was on her stretcher.  Next, she was placed supine on the fracture table with the right leg in in-line skeletal traction device with perineal post in place and the left hip flexed and abducted out of the field with appropriate padding of the popliteal area and the perineal area.  We then assessed the right hip under direct fluoroscopy and it really did not take really any traction and a little bit of internal rotation.  The fracture was anatomically reduced.  We then prepped the right hip with DuraPrep and sterile drapes.  A time-out was called and she was identified as correct patient, correct right hip.  I then made an incision proximal to the greater trochanter and placed a guide pin in an antegrade fashion once I dissected down the tip of the greater trochanter and placed a guide pin to the level of the lesser trochanter under direct fluoroscopy.  I then opened up the femoral canal with initiating reamer.  Preoperatively, we had already picked our femoral nail based on keeping it sterile in the box and lying over the leg before prepping and draping the shoes or diameter and length.  This is a stable fracture, so I was not concerned about reaming.  I placed the 10 x 380 femoral nail in antegrade fashion to the greater trochanter down the leg without any difficulty at all. Using the outrigger guide, I then made a separate incision in the lateral thigh and placed a guide pin in a near center-center position in the femoral head traversing the fracture in the femoral neck.  I took a measurement off of this and then drilled the depth for 100 mm  lag screw and placed the lag screw without difficulty.  I then locked the rod from the top and removed the outrigger guide.  I did not place any distal interlocks due to the stability of this fracture and not the need to do this and I chose a long nail having seen plenty of deep within bone have a stress riser fall again and break below the short nail.  I then copiously irrigated the 2 small wounds with normal saline solution.  I closed the deep tissue with 0-Vicryl followed by 2-0 Vicryl in the subcutaneous tissue, 4-0 Monocryl subcuticular stitch, and Steri-Strips on the skin over 2 small incisions, Steri-Strips and well-padded sterile dressing was applied.  She was then taken off the fracture table and taken to the recovery room in stable condition.  All final counts were correct.  There were no complications noted.     Vanita Pandahristopher Y. Magnus IvanBlackman, M.D.     CYB/MEDQ  D:  06/27/2014  T:  06/27/2014  Job:  [id number]

## 2014-06-27 NOTE — Anesthesia Procedure Notes (Signed)
Procedure Name: Intubation Date/Time: 06/27/2014 7:34 AM Performed by: Jarvis NewcomerARMISTEAD, Ashtin Rosner A Pre-anesthesia Checklist: Patient identified, Emergency Drugs available, Suction available and Patient being monitored Patient Re-evaluated:Patient Re-evaluated prior to inductionOxygen Delivery Method: Circle system utilized Preoxygenation: Pre-oxygenation with 100% oxygen Intubation Type: IV induction Ventilation: Mask ventilation without difficulty Laryngoscope Size: Mac and 4 Grade View: Grade I Tube type: Oral Number of attempts: 1 Airway Equipment and Method: Stylet Placement Confirmation: breath sounds checked- equal and bilateral,  ETT inserted through vocal cords under direct vision and positive ETCO2 Secured at: 21 cm Tube secured with: Tape Dental Injury: Teeth and Oropharynx as per pre-operative assessment

## 2014-06-27 NOTE — Anesthesia Postprocedure Evaluation (Signed)
  Anesthesia Post-op Note  Patient: Meghan Mueller  Procedure(s) Performed: Procedure(s) (LRB): INTRAMEDULLARY (IM) NAIL INTERTROCHANTRIC (Right)  Patient Location: PACU  Anesthesia Type: General  Level of Consciousness: awake and alert   Airway and Oxygen Therapy: Patient Spontanous Breathing  Post-op Pain: mild  Post-op Assessment: Post-op Vital signs reviewed, Patient's Cardiovascular Status Stable, Respiratory Function Stable, Patent Airway and No signs of Nausea or vomiting  Last Vitals:  Filed Vitals:   06/27/14 1053  BP: 115/56  Pulse: 55  Temp: 36.4 C  Resp: 15    Post-op Vital Signs: stable   Complications: No apparent anesthesia complications

## 2014-06-27 NOTE — Transfer of Care (Signed)
Immediate Anesthesia Transfer of Care Note  Patient: Meghan Mueller  Procedure(s) Performed: Procedure(s): INTRAMEDULLARY (IM) NAIL INTERTROCHANTRIC (Right)  Patient Location: PACU  Anesthesia Type:General  Level of Consciousness: awake, alert , oriented and patient cooperative  Airway & Oxygen Therapy: Patient Spontanous Breathing and Patient connected to face mask oxygen  Post-op Assessment: Report given to PACU RN, Post -op Vital signs reviewed and stable and Patient moving all extremities  Post vital signs: Reviewed and stable  Complications: No apparent anesthesia complications

## 2014-06-28 ENCOUNTER — Encounter (HOSPITAL_COMMUNITY): Payer: Self-pay | Admitting: Orthopaedic Surgery

## 2014-06-28 DIAGNOSIS — E43 Unspecified severe protein-calorie malnutrition: Secondary | ICD-10-CM | POA: Insufficient documentation

## 2014-06-28 LAB — CBC
HEMATOCRIT: 33.2 % — AB (ref 36.0–46.0)
Hemoglobin: 11.1 g/dL — ABNORMAL LOW (ref 12.0–15.0)
MCH: 32 pg (ref 26.0–34.0)
MCHC: 33.4 g/dL (ref 30.0–36.0)
MCV: 95.7 fL (ref 78.0–100.0)
Platelets: 95 10*3/uL — ABNORMAL LOW (ref 150–400)
RBC: 3.47 MIL/uL — AB (ref 3.87–5.11)
RDW: 12.7 % (ref 11.5–15.5)
WBC: 5 10*3/uL (ref 4.0–10.5)

## 2014-06-28 LAB — BASIC METABOLIC PANEL
Anion gap: 7 (ref 5–15)
BUN: 10 mg/dL (ref 6–23)
CO2: 28 meq/L (ref 19–32)
Calcium: 9 mg/dL (ref 8.4–10.5)
Chloride: 105 mEq/L (ref 96–112)
Creatinine, Ser: 0.3 mg/dL — ABNORMAL LOW (ref 0.50–1.10)
GFR calc Af Amer: >90 mL/min (ref >90)
GFR calc non Af Amer: >90 mL/min (ref >90)
Glucose, Bld: 106 mg/dL — ABNORMAL HIGH (ref 70–99)
Potassium: 4.4 mEq/L (ref 3.7–5.3)
Sodium: 140 mEq/L (ref 137–147)

## 2014-06-28 MED ORDER — HYDROCODONE-ACETAMINOPHEN 5-325 MG PO TABS: 1.0000 | ORAL_TABLET | ORAL | Status: AC | PRN

## 2014-06-28 MED ORDER — BOOST PLUS PO LIQD
237.0000 mL | Freq: Two times a day (BID) | ORAL | Status: DC
  Administered 2014-06-28 – 2014-06-29 (×2): 237 mL via ORAL
  Filled 2014-06-28 (×4): qty 237

## 2014-06-28 MED ORDER — ASPIRIN 325 MG PO TBEC: 325.0000 mg | DELAYED_RELEASE_TABLET | Freq: Two times a day (BID) | ORAL | Status: AC

## 2014-06-28 NOTE — Progress Notes (Signed)
Subjective: 1 Day Post-Op Procedure(s) (LRB): INTRAMEDULLARY (IM) NAIL INTERTROCHANTRIC (Right) Patient reports pain as mild.  Has not been up.  Wants to go back to [place] as soon as she can travel.  Objective: Vital signs in last 24 hours: Temp:  [97.4 F (36.3 C)-98.9 F (37.2 C)] 98.3 F (36.8 C) (12/14 0357) Pulse Rate:  [48-74] 74 (12/14 0357) Resp:  [8-16] 16 (12/14 0357) BP: (91-132)/(45-91) 118/61 mmHg (12/14 0357) SpO2:  [97 %-100 %] 97 % (12/14 0357)  Intake/Output from previous day: 12/13 0701 - 12/14 0700 In: 4378.8 [P.O.:360; I.V.:3868.8; IV Piggyback:150] Out: 1825 [Urine:1750; Blood:75] Intake/Output this shift:     Recent Labs  06/26/14 1447 06/27/14 0550 06/28/14 0445  HGB 13.4 13.5 11.1*    Recent Labs  06/27/14 0550 06/28/14 0445  WBC 3.2* 5.0  RBC 4.20 3.47*  HCT 40.1 33.2*  PLT 122* 95*    Recent Labs  06/27/14 0550 06/28/14 0445  NA 139 140  K 3.4* 4.4  CL 98 105  CO2 28 28  BUN 12 10  CREATININE 0.30* 0.30*  GLUCOSE 79 106*  CALCIUM 9.2 9.0    Recent Labs  06/26/14 1447  INR 1.18    Sensation intact distally Intact pulses distally Dorsiflexion/Plantar flexion intact Incision: scant drainage  Assessment/Plan: 1 Day Post-Op Procedure(s) (LRB): INTRAMEDULLARY (IM) NAIL INTERTROCHANTRIC (Right) Up with therapy, WBAT right hip. Discharge when clears therapy.  BLACKMAN,CHRISTOPHER Y 06/28/2014, 7:05 AM

## 2014-06-28 NOTE — Evaluation (Signed)
Occupational Therapy Evaluation Patient Details Name: Meghan Mueller MRN: [id number] DOB: [birth year] Today's Date: 06/28/2014    History of Present Illness 79 yo female who has a history of polymyalgias/polymyositis who ambulates with a walker and admitted after sustaining an accidental mechanical fall earlier with R hip fx, currently s/p R IM nail.   Clinical Impression   Pt is s/p fall with hip fracute resulting in the deficits listed below (see OT Problem List).  Pt will benefit from skilled OT to increase their safety and independence with ADL and functional mobility for ADL to facilitate discharge to venue listed below.      Follow Up Recommendations  Home health OT;Other (comment) (depending on A at home and travel plans)    Equipment Recommendations  None recommended by OT       Precautions / Restrictions Precautions Precautions: Fall Restrictions RLE Weight Bearing: Weight bearing as tolerated Other Position/Activity Restrictions: pt has bilateral knee braces likely for decreasing hyperextension      Mobility Bed Mobility Overal bed mobility: Needs Assistance Bed Mobility: Sit to Supine     Supine to sit: Supervision;HOB elevated Sit to supine: Mod assist   General bed mobility comments: pt in chair  Transfers Overall transfer level: Needs assistance Equipment used: Rolling walker (2 wheeled) Transfers: Sit to/from Stand Sit to Stand: +2 physical assistance;Max assist         General transfer comment: unable to reach standing with one person A. Pt kept walking feet out to front.  Encouraged pt to push up with L LE. Pt stated this is not the way she normally does it and is worried she cant do it this way. Daughter demonstrated how pt usually gets up and it is by waklking out.           ADL Overall ADL's : Needs assistance/impaired Eating/Feeding: Set up;Sitting       Upper Body Bathing: Set up;Sitting   Lower Body Bathing: Sit to/from stand;+2  for physical assistance;Maximal assistance   Upper Body Dressing : Set up;Sitting   Lower Body Dressing: Maximal assistance;+2 for physical assistance;Sit to/from stand       Toileting- ArchitectClothing Manipulation and Hygiene: Maximal assistance;+2 for physical assistance;Sit to/from stand         General ADL Comments: OT unable to help pt from sit to stand from chair.  OT had to call for Rn to A with sit to stand transition.  Did elevate chair with 2 pillows and pt still needed 2 person A.  This is worriesome to pt and daughter as plans on flying home this week.  Daughter to call airline and ask what A is available.  CM also aware.               Pertinent Vitals/Pain Pain Assessment: 0-10 Pain Score: 4  Pain Location: r hip Pain Descriptors / Indicators: Aching;Sore Pain Intervention(s): Limited activity within patient's tolerance;Repositioned     Hand Dominance     Extremity/Trunk Assessment Upper Extremity Assessment Upper Extremity Assessment: Generalized weakness   Lower Extremity Assessment Lower Extremity Assessment: RLE deficits/detail;LLE deficits/detail RLE Deficits / Details: hyperextension during mobility, wears bilateral knee braces, pt assisted R LE using UEs for mobility tasks LLE Deficits / Details: hyperextension during mobility, wears bilateral knee braces       Communication Communication Communication: No difficulties   Cognition Arousal/Alertness: Awake/alert Behavior During Therapy: WFL for tasks assessed/performed Overall Cognitive Status: Within Functional Limits for tasks assessed  General Comments    Pt needed increased A this session than with PT.             Home Living Family/patient expects to be discharged to:: Private residence Living Arrangements: Spouse/significant other                           Home Equipment: Environmental consultantWalker - 2 wheels;Electric scooter   Additional Comments: pt visiting family  here, from [place], was staying in hotel prior to admission, plans to fly home upon d/c, daughter to assist pt upon d/c prior to back to [place]      Prior Functioning/Environment Level of Independence: Independent with assistive device(s)        Comments: mostly uses electric scooter however does ambulate short distances with RW    OT Diagnosis: Generalized weakness;Acute pain   OT Problem List: Decreased strength;Decreased activity tolerance;Pain   OT Treatment/Interventions: Self-care/ADL training;DME and/or AE instruction;Patient/family education    OT Goals(Current goals can be found in the care plan section) Acute Rehab OT Goals Patient Stated Goal: get back to [place] asap OT Goal Formulation: With patient Time For Goal Achievement: 07/12/14 Potential to Achieve Goals: Good  OT Frequency: Min 2X/week    End of Session Equipment Utilized During Treatment: Rolling walker Nurse Communication: Mobility status  Activity Tolerance: Patient tolerated treatment well Patient left: in chair;with call bell/phone within reach   Time: 1225-1323 OT Time Calculation (min): 58 min Charges:  OT General Charges $OT Visit: 1 Procedure OT Evaluation $Initial OT Evaluation Tier I: 1 Procedure OT Treatments $Self Care/Home Management : 38-52 mins G-Codes:    Einar CrowEDDING, Linzie Criss D 06/28/2014, 1:40 PM

## 2014-06-28 NOTE — Progress Notes (Signed)
Patient ID: Meghan Mueller, female   DOB: Mar 13, 1935, 79 y.o.   MRN: [id number]  [facility] PROGRESS NOTE  Meghan PacasMary Lynn Mueller [id number] DOB: Mar 13, 1935 DOA: 06/26/2014 PCP: No primary care provider on file.   Brief narrative: 79 yo female who has a history of polymyalgias/polymyositis, ambulates with a walker at baseline, [facility] of right hip fracture after an episode of fall.   Assessment and Plan:    Active Problems:  Closed right hip fracture - Right nondisplaced intertrochanteric versus low basicervical proximal femur fracture. - status post ORIF of right proximal femur fx, clinically stable post op day #1 - continue analgesia as needed - PT evaluation done, recommend HH PT upon discharge - plan for d.c in AM  Hypokalemia - supplemented and WNL   Severe PCM - in the context of acute illness  - advance diet as pt able to tolerate   DVT prophylaxis  SCD's, aspirin 325 mg PO QD  Code Status: Full Family Communication: Pt at bedside Disposition Plan: Plan for dc in AM  IV Access:    Peripheral IV Procedures and diagnostic studies:    Dg Chest 1 View 06/26/2014 No acute abnormality noted.   Dg Hip Complete Right 06/26/2014 Right basicervical hip fracture, nondisplaced.  Medical Consultants:    Ortho Other Consultants:    Physical therapy  Anti-Infectives:    None  Debbora PrestoMAGICK-Kealy Lewter, MD  [facility] Pager [phone]  If 7PM-7AM, please contact night-coverage [email or web address] Password [email or web address] 06/28/2014, 4:04 PM   LOS: 2 days   HPI/Subjective: No events overnight.   Objective: Filed Vitals:   06/27/14 2150 06/28/14 0357 06/28/14 1039 06/28/14 1438  BP: 102/55 118/61 124/48 129/61  Pulse: 57 74 71 74  Temp: 98.9 F (37.2 C) 98.3 F (36.8 C) 96.3 F (35.7 C) 97.2 F (36.2 C)  TempSrc: Oral Oral Oral Axillary  Resp: 16 16 16 14   Height:      Weight:      SpO2: 99% 97% 97% 95%     Intake/Output Summary (Last 24 hours) at 06/28/14 1604 Last data filed at 06/28/14 1438  Gross per 24 hour  Intake 2427.5 ml  Output   2000 ml  Net  427.5 ml    Exam:   General:  Pt is alert, follows commands appropriately, not in acute distress  Cardiovascular: Regular rate and rhythm, S1/S2, no murmurs, no rubs, no gallops  Respiratory: Clear to auscultation bilaterally, no wheezing, no crackles, no rhonchi  Abdomen: Soft, non tender, non distended, bowel sounds present, no guarding  Extremities: No edema, pulses DP and PT palpable bilaterally  Neuro: Grossly nonfocal  Data Reviewed: Basic Metabolic Panel:  Recent Labs Lab 06/26/14 1447 06/27/14 0550 06/28/14 0445  NA 144 139 140  K 3.6* 3.4* 4.4  CL 102 98 105  CO2 28 28 28   GLUCOSE 86 79 106*  BUN 12 12 10   CREATININE 0.36* 0.30* 0.30*  CALCIUM 9.3 9.2 9.0   CBC:  Recent Labs Lab 06/26/14 1447 06/27/14 0550 06/28/14 0445  WBC 4.4 3.2* 5.0  NEUTROABS 3.4  --   --   HGB 13.4 13.5 11.1*  HCT 39.5 40.1 33.2*  MCV 94.5 95.5 95.7  PLT PLATELET CLUMPS NOTED ON SMEAR, COUNT APPEARS ADEQUATE 122* 95*    Recent Results (from the past 240 hour(s))  Surgical pcr screen     Status: None   Collection Time: 06/26/14  8:32 PM  Result Value Ref Range Status  MRSA, PCR NEGATIVE NEGATIVE Final   Staphylococcus aureus NEGATIVE NEGATIVE Final    Comment:        The Xpert SA Assay (FDA approved for NASAL specimens in patients over 77 years of age), is one component of a comprehensive surveillance program.  Test performance has been validated by [facility] for patients greater than or equal to 135 year old. It is not intended to diagnose infection nor to guide or monitor treatment.      Scheduled Meds: . aspirin EC  325 mg Oral Q breakfast  . lactose free nutrition  237 mL Oral BID BM   Continuous Infusions:

## 2014-06-28 NOTE — Evaluation (Signed)
Physical Therapy Evaluation Patient Details Name: Meghan Mueller MRN: [id number] DOB: November 05, 1934 Today's Date: 06/28/2014   History of Present Illness  79 yo female who has a history of polymyalgias/polymyositis who ambulates with a walker and admitted after sustaining an accidental mechanical fall earlier with R hip fx, currently s/p R IM nail.  Clinical Impression  Pt admitted with above diagnosis. Pt currently with functional limitations due to the deficits listed below (see PT Problem List).  Pt will benefit from skilled PT to increase their independence and safety with mobility to allow discharge to the venue listed below.  Pt typically uses scooter for mobility however able to ambulate short distances with RW.  Pt reports she plans to d/c from hospital and fly back to [place].  Pt will need to practice performing transfers prior to d/c.  Returned to room after evaluation with CSW and family then present so discussed d/c plan.  Family reports pt will have assist to and from airport (as well as onto and off plane using w/c).  Pt and family prefer for pt to leave hospital and go directly to airport.  Will continue to assist with mobility for safe transfers however asked family and pt to discuss medical status for flying after surgery with MD.     Follow Up Recommendations Home health PT;Supervision/Assistance - 24 hour    Equipment Recommendations       Recommendations for Other Services       Precautions / Restrictions Precautions Precautions: Fall Restrictions RLE Weight Bearing: Weight bearing as tolerated Other Position/Activity Restrictions: pt has bilateral knee braces likely for decreasing hyperextension      Mobility  Bed Mobility Overal bed mobility: Needs Assistance Bed Mobility: Supine to Sit     Supine to sit: Supervision;HOB elevated     General bed mobility comments: pt self assisted R LE with UEs  Transfers Overall transfer level: Needs  assistance Equipment used: Rolling walker (2 wheeled) Transfers: Sit to/from Stand Sit to Stand: Min assist         General transfer comment: assist to rise and steady, pt tends to keep feet forward and then walk back during transfers, increased bil hyperextension even with knee braces  Increased time for transfers due to positioning of LEs  Ambulation/Gait Ambulation/Gait assistance: Min assist Ambulation Distance (Feet): 25 Feet Assistive device: Rolling walker (2 wheeled) Gait Pattern/deviations: Step-to pattern;Trunk flexed     General Gait Details: increased bil hyperextension even with knee braces  Stairs            Wheelchair Mobility    Modified Rankin (Stroke Patients Only)       Balance                                             Pertinent Vitals/Pain Pain Assessment: 0-10 Pain Score: 3  Pain Location: R hip Pain Descriptors / Indicators: Aching;Sore Pain Intervention(s): Limited activity within patient's tolerance;Monitored during session;Premedicated before session;Repositioned;Ice applied    Home Living Family/patient expects to be discharged to:: Private residence Living Arrangements: Spouse/significant other             Home Equipment: Environmental consultantWalker - 2 wheels;Electric scooter Additional Comments: pt visiting family here, from [place], was staying in hotel prior to admission, plans to fly home upon d/c, daughter to assist pt upon d/c prior to back to [place]  Prior Function Level of Independence: Independent with assistive device(s)         Comments: mostly uses electric scooter however does ambulate short distances with RW     Hand Dominance        Extremity/Trunk Assessment               Lower Extremity Assessment: RLE deficits/detail;LLE deficits/detail RLE Deficits / Details: hyperextension during mobility, wears bilateral knee braces, pt assisted R LE using UEs for mobility tasks LLE Deficits / Details:  hyperextension during mobility, wears bilateral knee braces     Communication   Communication: No difficulties  Cognition Arousal/Alertness: Awake/alert Behavior During Therapy: WFL for tasks assessed/performed Overall Cognitive Status: Within Functional Limits for tasks assessed                      General Comments      Exercises        Assessment/Plan    PT Assessment Patient needs continued PT services  PT Diagnosis Difficulty walking;Acute pain   PT Problem List Decreased strength;Decreased mobility;Pain;Decreased activity tolerance  PT Treatment Interventions DME instruction;Gait training;Functional mobility training;Patient/family education;Therapeutic activities;Therapeutic exercise;Wheelchair mobility training   PT Goals (Current goals can be found in the Care Plan section) Acute Rehab PT Goals PT Goal Formulation: With patient Time For Goal Achievement: 07/02/14 Potential to Achieve Goals: Good    Frequency Min 5X/week   Barriers to discharge        Co-evaluation               End of Session   Activity Tolerance: Patient tolerated treatment well Patient left: in chair;with call bell/phone within reach           Time: 0908-0932 PT Time Calculation (min) (ACUTE ONLY): 24 min   Charges:   PT Evaluation $Initial PT Evaluation Tier I: 1 Procedure PT Treatments $Gait Training: 8-22 mins $Therapeutic Activity: 8-22 mins   PT G Codes:          Mithcell Schumpert,KATHrine E 06/28/2014, 11:22 AM Zenovia JarredKati Ricky Doan, PT, DPT 06/28/2014 Pager: [phone]

## 2014-06-28 NOTE — Care Management Note (Signed)
    Page 1 of 1   06/30/2014     4:44:37 PM CARE MANAGEMENT NOTE 06/30/2014  Patient:  Meghan Mueller,Meghan Mueller   Account Number:  [id number]  Date Initiated:  06/28/2014  Documentation initiated by:  Lorenda IshiharaPEELE,SUZANNE  Subjective/Objective Assessment:   79 yo female admitted s/p fall with hip fracture and IM nailing. PTA lived at home with spouse in [place].     Action/Plan:   Patient was here visiting and fell at hotel. Patient requesting to fly to [place] tomorrow.   Anticipated DC Date:  06/29/2014   Anticipated DC Plan:  HOME/SELF CARE  In-house referral  Clinical Social Worker      DC Planning Services  CM consult      Choice offered to / List presented to:             Status of service:  Completed, signed off Medicare Important Message given?   (If response is "NO", the following Medicare IM given date fields will be blank) Date Medicare IM given:   Medicare IM given by:   Date Additional Medicare IM given:   Additional Medicare IM given by:    Discharge Disposition:  HOME/SELF CARE  Per UR Regulation:  Reviewed for med. necessity/level of care/duration of stay  If discussed at Long Length of Stay Meetings, dates discussed:    Comments:  06/30/1508:00 CM faxed orders, facesheet, H&P, DC Summary to [facility] (given to me by pt)  to arrange HHPT/OT/RN.  CM received 2 calls from [facility] as they were unable to get with prt's PCP, Dr. Lenise ArenaMeyers or orthopedist, Dr Pricilla HandlerAcura (both in her area) to sign or take responsibility for future orders bc pt had not seen either MD for over a year.  CM called pt and no answer; Cm called and left on verified voicemail this message and encouraged pt to make an immediate appointment with either or both of these MDs to follow up and arrange physical therapy, occupational therapy and RN care.  No other Cm needs were communicatred.  Freddy JakschSarah Julien Berryman, BSn, [place] [phone].   06-28-14 Lorenda IshiharaSuzanne Peele RN CM 1600 Spoke with patient and family earlier. They all want patient to d/c to  [place] in am. Patient has flight scheduled. Patient has not been released to fly. Discussed d/c options including [place], [place]. Patient admamant that she d/c back home in [place]. Awaiting release by attending.

## 2014-06-28 NOTE — Progress Notes (Signed)
Awakened looking for"wheel chair". Concerned about husbands plans to leave today. Reoriented to plans discussed earlier in night with spouse and son. Spouse has meeting for work he must attend in [place] on Tuesday 06/29/14. She reorients easily.

## 2014-06-28 NOTE — Progress Notes (Signed)
INITIAL NUTRITION ASSESSMENT  DOCUMENTATION CODES Per approved criteria  -Severe malnutrition in the context of chronic illness -Underweight  Pt meets criteria for severe MALNUTRITION in the context of chronic illness as evidenced by severe muscle and fat depletion.  INTERVENTION: -Provide Boost Plus BID, providing 360 kcal and 14g of protein. -Provide Magic cup BID with meals, each supplement provides 290 kcal and 9 grams of protein -Encourage PO intake -RD to continue to monitor  NUTRITION DIAGNOSIS: Underweight related to difficulty swallowing as evidenced by BMI of 17.7.   Goal: Pt to meet >/= 90% of their estimated nutrition needs   Monitor:  PO and supplemental intake, weight, labs, I/O's  Reason for Assessment: Pt identified as at nutrition risk on the Malnutrition Screen Tool  Admitting Dx: Closed right hip fracture  ASSESSMENT:  79 y.o. female with a history of bilateral leg weakness due to polymyositis from [place] who presents to the hospital due to a fall at the hotel where she was staying.  12/13 S/p Post-Op Procedure(s) (LRB): INTRAMEDULLARY (IM) NAIL INTERTROCHANTRIC   Pt reports weight loss over the last couple of years. Pt reports not eating that much when she does eat d/t her swallowing issues.  Family has brought Boost from home for patient to drink instead of the Ensure that is ordered. RD to order Boost Plus for patient to try. Pt would also like to try magic cups with meals.   Provided pt with ice cream cup during visit.  Nutrition Focused Physical Exam:  Subcutaneous Fat:  Orbital Region: WNL Upper Arm Region: severe depletion Thoracic and Lumbar Region: NA  Muscle:  Temple Region: moderate depletion Clavicle Bone Region: severe depletion Clavicle and Acromion Bone Region: severe depletion Scapular Bone Region: severe depletion Dorsal Hand: mild depletion Patellar Region: NA Anterior Thigh Region: NA Posterior Calf Region: NA  Edema: RLE  nonpitting  Labs reviewed: Low Creatinine  Height: Ht Readings from Last 1 Encounters:  06/26/14 5\' 7"  (1.702 m)    Weight: Wt Readings from Last 1 Encounters:  06/26/14 112 lb 10.5 oz (51.1 kg)    Ideal Body Weight: 135 lb  % Ideal Body Weight: 83%  Wt Readings from Last 10 Encounters:  06/26/14 112 lb 10.5 oz (51.1 kg)    Usual Body Weight: 113 lb  % Usual Body Weight: 100%  BMI:  Body mass index is 17.64 kg/(m^2).  Estimated Nutritional Needs: Kcal: 1500-1700 Protein: 75-85g Fluid: 1.5L/day  Skin: right hip incision  Diet Order: Diet regular  EDUCATION NEEDS: -No education needs identified at this time   Intake/Output Summary (Last 24 hours) at 06/28/14 1426 Last data filed at 06/28/14 1424  Gross per 24 hour  Intake 2067.5 ml  Output   2000 ml  Net   67.5 ml    Last BM: 12/10  Labs:   Recent Labs Lab 06/26/14 1447 06/27/14 0550 06/28/14 0445  NA 144 139 140  K 3.6* 3.4* 4.4  CL 102 98 105  CO2 28 28 28   BUN 12 12 10   CREATININE 0.36* 0.30* 0.30*  CALCIUM 9.3 9.2 9.0  GLUCOSE 86 79 106*    CBG (last 3)  No results for input(s): GLUCAP in the last 72 hours.  Scheduled Meds: . aspirin EC  325 mg Oral Q breakfast  . feeding supplement (ENSURE COMPLETE)  237 mL Oral BID BM    Continuous Infusions:   Past Medical History  Diagnosis Date  . Polymyositis with myopathy     Past Surgical  History  Procedure Laterality Date  . Eye surgery    . Throat surgery      Tilda FrancoLindsey Aesha Agrawal, MS, RD, LDN Pager: [phone] After Hours Pager: [phone]

## 2014-06-28 NOTE — Discharge Instructions (Signed)
You can put full weight as comofort allows on your right hip. You can get your actual incisions wet in the shower starting 07/02/14; remove the dressing but leave the steri-strips in place. New dry dressing daily starting 07/02/14. You should try to get an appointment with your home Orthopedic surgeon sometime within the next 3-4 weeks.

## 2014-06-28 NOTE — Progress Notes (Signed)
CSW is available to assist with d/c planning. Pt is hoping to return home following hospital d/c. PN reviewed and CSW met with pt / family. PT / OT are recommending [place] services / supervision. If plan changes and ST Rehab is needed, prior to returning to [place], [place] will assist.  Werner Lean LCSW [phone]

## 2014-06-29 LAB — BASIC METABOLIC PANEL
Anion gap: 9 (ref 5–15)
BUN: 11 mg/dL (ref 6–23)
CHLORIDE: 102 meq/L (ref 96–112)
CO2: 30 mEq/L (ref 19–32)
Calcium: 8.6 mg/dL (ref 8.4–10.5)
Creatinine, Ser: 0.27 mg/dL — ABNORMAL LOW (ref 0.50–1.10)
GFR calc Af Amer: >90 mL/min (ref >90)
GLUCOSE: 100 mg/dL — AB (ref 70–99)
Potassium: 3.6 mEq/L — ABNORMAL LOW (ref 3.7–5.3)
Sodium: 141 mEq/L (ref 137–147)

## 2014-06-29 LAB — CBC
HCT: 33.4 % — ABNORMAL LOW (ref 36.0–46.0)
HEMOGLOBIN: 11 g/dL — AB (ref 12.0–15.0)
MCH: 32 pg (ref 26.0–34.0)
MCHC: 32.9 g/dL (ref 30.0–36.0)
MCV: 97.1 fL (ref 78.0–100.0)
Platelets: 96 10*3/uL — ABNORMAL LOW (ref 150–400)
RBC: 3.44 MIL/uL — ABNORMAL LOW (ref 3.87–5.11)
RDW: 13.1 % (ref 11.5–15.5)
WBC: 2.5 10*3/uL — AB (ref 4.0–10.5)

## 2014-06-29 MED ORDER — POTASSIUM CHLORIDE CRYS ER 20 MEQ PO TBCR
40.0000 meq | EXTENDED_RELEASE_TABLET | Freq: Once | ORAL | Status: AC
  Administered 2014-06-29: 40 meq via ORAL
  Filled 2014-06-29: qty 2

## 2014-06-29 NOTE — Progress Notes (Signed)
Physical Therapy Treatment Patient Details Name: Meghan Mueller MRN: [id number] DOB: 09/03/1934 Today's Date: 06/29/2014    History of Present Illness 79 yo female who has a history of polymyalgias/polymyositis who ambulates with a walker and admitted after sustaining an accidental mechanical fall earlier with R hip fx, currently s/p R IM nail.    PT Comments    AM session assisted to EOB and applied shoes.  Amb in hallway a limited distance.  Pt wears B hinge knee braces due to B knee hyper extension.  Very unsteady gait.  Returned to room then performed R LE TE's followed by ICE.   Follow Up Recommendations  Home health PT;Supervision/Assistance - 24 hour     Equipment Recommendations       Recommendations for Other Services       Precautions / Restrictions Precautions Precautions: Fall Precaution Comments: wears B knee braces for hyper extension  Restrictions Weight Bearing Restrictions: No RLE Weight Bearing: Weight bearing as tolerated    Mobility  Bed Mobility Overal bed mobility: Needs Assistance Bed Mobility: Supine to Sit     Supine to sit: Supervision     General bed mobility comments: increased time and assist R LE  Transfers Overall transfer level: Needs assistance Equipment used: Rolling walker (2 wheeled) Transfers: Sit to/from Stand Sit to Stand: Mod assist         General transfer comment: pt has her own way of performing this.  Excessive use B UE's to push self up with tendency to keep B LE's in extension and walk back as she rises.   Same concept in reverse with stand to sit.   Ambulation/Gait Ambulation/Gait assistance: Min assist Ambulation Distance (Feet): 28 Feet Assistive device: Rolling walker (2 wheeled) Gait Pattern/deviations: Step-to pattern;Step-through pattern;Decreased step length - left;Decreased step length - right;Wide base of support Gait velocity: decreased   General Gait Details: increased bil hyperextension even  with knee braces.  Unsteady gait.  Limited knee flex.   Stairs            Wheelchair Mobility    Modified Rankin (Stroke Patients Only)       Balance                                    Cognition                            Exercises   Hip  TE's 10 reps ankle pumps 10 reps knee presses 10 reps heel slides 10 reps SAQ's 10 reps ABD Followed by ICE     General Comments        Pertinent Vitals/Pain Pain Assessment: 0-10 Pain Score: 3  Pain Location: R hip Pain Descriptors / Indicators: Constant;Sore Pain Intervention(s): Monitored during session;Repositioned;Ice applied    Home Living                      Prior Function            PT Goals (current goals can now be found in the care plan section) Acute Rehab PT Goals Patient Stated Goal: get back to [place] asap Progress towards PT goals: Progressing toward goals    Frequency  Min 5X/week    PT Plan      Co-evaluation             End of  Session Equipment Utilized During Treatment: Gait belt Activity Tolerance: Patient tolerated treatment well Patient left: in chair;with call bell/phone within reach     Time: 1002-1033 PT Time Calculation (min) (ACUTE ONLY): 31 min  Charges:  $Gait Training: 8-22 mins $Therapeutic Exercise: 8-22 mins                    G Codes:      Felecia ShellingLori Clyde Upshaw  PTA [facility]  Acute  Rehab Pager      [phone]

## 2014-06-29 NOTE — Progress Notes (Signed)
Occupational Therapy Treatment Patient Details Name: Meghan PacasMary Lynn Mueller MRN: [id number] DOB: 01-06-1935 Today's Date: 06/29/2014    History of present illness 79 yo female who has a history of polymyalgias/polymyositis who ambulates with a walker and admitted after sustaining an accidental mechanical fall earlier with R hip fx, currently s/p R IM nail.   OT comments  Pt is making progress in OT.  Needs A x 2 when getting up from commode.  Pt plans home tomorrow via plane and CM/RN report all arrangements have been made and family has checked extensively into assistance.  Follow Up Recommendations  Home health OT;Supervision/Assistance - 24 hour (will need second person initially for commode)    Equipment Recommendations  None recommended by OT    Recommendations for Other Services      Precautions / Restrictions Precautions Precautions: Fall Restrictions Weight Bearing Restrictions: No RLE Weight Bearing: Weight bearing as tolerated       Mobility Bed Mobility           Sit to supine: Min assist   General bed mobility comments: assist for RLE. Pt used bil UEs to help LLE onto bed  Transfers   Equipment used: Rolling walker (2 wheeled) Transfers: Sit to/from Stand Sit to Stand: Min assist;+2 physical assistance (one person to hold back of commode; then min A to stand)              Balance                                   ADL Overall ADL's : Needs assistance/impaired                         Toilet Transfer: Minimal assistance;+2 for physical assistance;BSC   Toileting- Clothing Manipulation and Hygiene: Total assistance;Sit to/from stand         General ADL Comments: pt had just completed bathing from 3:1 commode.  Assisted with peri care:  pt is unable to release a hand in standing. When attempting to stand with A x `1, pt was pulling on commode and this sturdy commode was tipping.  NT came and held commode from back to  stabilize.  Pt wears knee braces and starts with both legs in front of her and walks them back as she raises trunk.  Per CM, she is flying to [place] tomorrow and they have checked into assist extensively and have everything arranged. She will wear a depends type of pad.  Her husband is her help at home:  daughter in law lives nearby and can assist.  Educated that she may need A x 2 to get off of commode (one person to hold commode and second to assist her).  She does not have a grab bar by commode.  Recommend she sponge bathe and wash hair at sink with help or use dry shampoo.  Pt helps bil legs onto bed with arms:  showed leg lifter and explained sheet to assist.  Pt has decreased control when sitting      Vision                     Perception     Praxis      Cognition   Behavior During Therapy: [facility] for tasks assessed/performed Overall Cognitive Status: Within Functional Limits for tasks assessed  Extremity/Trunk Assessment               Exercises     Shoulder Instructions       General Comments      Pertinent Vitals/ Pain       Pain Assessment: 0-10 Pain Score: 3  Pain Location: R hip Pain Descriptors / Indicators: Sore Pain Intervention(s): Limited activity within patient's tolerance;Monitored during session;Repositioned;RN gave pain meds during session  Home Living                                          Prior Functioning/Environment              Frequency Min 2X/week     Progress Toward Goals  OT Goals(current goals can now be found in the care plan section)  Progress towards OT goals: Progressing toward goals     Plan      Co-evaluation                 End of Session     Activity Tolerance Patient tolerated treatment well   Patient Left in bed;with call bell/phone within reach   Nurse Communication          Time: 0822-0850 OT Time Calculation (min): 28 min  Charges: OT  General Charges $OT Visit: 1 Procedure OT Treatments $Self Care/Home Management : 23-37 mins  Meghan Mueller 06/29/2014, 9:04 AM  Meghan OtterMaryellen Cyrstal Mueller, OTR/L [phone] 06/29/2014

## 2014-06-29 NOTE — Progress Notes (Signed)
Physical Therapy Treatment Patient Details Name: Meghan PacasMary Lynn Mueller MRN: [id number] DOB: 27-Mar-1935 Today's Date: 06/29/2014    History of Present Illness 79 yo female who has a history of polymyalgias/polymyositis who ambulates with a walker and admitted after sustaining an accidental mechanical fall earlier with R hip fx, currently s/p R IM nail.    PT Comments    PM session amb to BR to void then a short distance in hallway.  Assisted back to bed per pt request.  Daughter present during session.  Pt plans to leave early tomorrow am.  Follow Up Recommendations  Home health PT;Supervision/Assistance - 24 hour     Equipment Recommendations    has a walker at home in [place]   Recommendations for Other Services       Precautions / Restrictions Precautions Precautions: Fall Precaution Comments: wears B knee braces for hyper extension  Restrictions Weight Bearing Restrictions: No RLE Weight Bearing: Weight bearing as tolerated    Mobility  Bed Mobility Overal bed mobility: Needs Assistance Bed Mobility: Supine to Sit     Supine to sit: Supervision     General bed mobility comments: increased time and assist R LE  Transfers Overall transfer level: Needs assistance Equipment used: Rolling walker (2 wheeled) Transfers: Sit to/from Stand Sit to Stand: Mod assist         General transfer comment: pt has her own way of performing this.  Excessive use B UE's to push self up with tendency to keep B LE's in extension and walk back as she rises.   Same concept in reverse with stand to sit.   Ambulation/Gait Ambulation/Gait assistance: Min assist Ambulation Distance (Feet): 28 Feet Assistive device: Rolling walker (2 wheeled) Gait Pattern/deviations: Step-to pattern;Step-through pattern;Decreased step length - left;Decreased step length - right;Wide base of support Gait velocity: decreased   General Gait Details: increased bil hyperextension even with knee braces.   Unsteady gait.  Limited knee flex.   Stairs            Wheelchair Mobility    Modified Rankin (Stroke Patients Only)       Balance                                    Cognition                            Exercises      General Comments        Pertinent Vitals/Pain Pain Assessment: 0-10 Pain Score: 3  Pain Location: R hip Pain Descriptors / Indicators: Constant;Sore Pain Intervention(s): Monitored during session;Repositioned;Ice applied    Home Living                      Prior Function            PT Goals (current goals can now be found in the care plan section) Acute Rehab PT Goals Patient Stated Goal: get back to [place] asap Progress towards PT goals: Progressing toward goals    Frequency  Min 5X/week    PT Plan      Co-evaluation             End of Session Equipment Utilized During Treatment: Gait belt Activity Tolerance: Patient tolerated treatment well Patient left: in chair;with call bell/phone within reach     Time: 1440-1503 PT Time Calculation (  min) (ACUTE ONLY): 23 min  Charges:  $Gait Training: 8-22 mins $Therapeutic Activity: 8-22 mins                    G Codes:      Felecia ShellingLori Kile Kabler  PTA [facility]  Acute  Rehab Pager      [phone]

## 2014-06-29 NOTE — Discharge Summary (Signed)
Physician Discharge Summary  Meghan PacasMary Lynn Mueller ZOX:[id number] DOB: 1935-07-14 DOA: 06/26/2014  PCP: No primary care provider on file.  Admit date: 06/26/2014 Discharge date: 06/30/2014  Recommendations for Outpatient Follow-up:  1. Pt will need to follow up with PCP in 2-3 weeks post discharge 2. Please obtain BMP to evaluate electrolytes and kidney function 3. Please also check CBC to evaluate Hg and Hct levels  Discharge Diagnoses:  Active Problems:   Closed right hip fracture   Protein-calorie malnutrition, severe   Discharge Condition: Stable  Diet recommendation: Heart healthy diet discussed in details   Brief narrative: 79 yo female who has a history of polymyalgias/polymyositis, ambulates with a walker at baseline, [facility] fracture after an episode of fall.   Assessment and Plan:    Active Problems:  Closed right hip fracture - Right nondisplaced intertrochanteric versus low basicervical proximal femur fracture. - status post ORIF of right proximal femur fx, clinically stable post op day 2 - continue analgesia as needed - PT evaluation done, recommend HH PT upon discharge - plan for d.c in AM  Hypokalemia - supplemented  Severe PCM - in the context of acute illness  - advanced diet as pt able to tolerate   DVT prophylaxis  SCD's, aspirin 325 mg PO QD  Code Status: Full Family Communication: Pt at bedside Disposition Plan: Plan for dc in AM  IV Access:    Peripheral IV Procedures and diagnostic studies:    Dg Chest 1 View 06/26/2014 No acute abnormality noted.   Dg Hip Complete Right 06/26/2014 Right basicervical hip fracture, nondisplaced.  Medical Consultants:    Ortho Other Consultants:    Physical therapy  Anti-Infectives:    None   Discharge Exam: Filed Vitals:   06/29/14 1547  BP:   Pulse:   Temp:   Resp: 16   Filed Vitals:   06/29/14 0800  06/29/14 1124 06/29/14 1430 06/29/14 1547  BP:   115/57   Pulse:   78   Temp:   98 F (36.7 C)   TempSrc:      Resp: 16 16 18 16   Height:      Weight:      SpO2:   97%     General: Pt is alert, follows commands appropriately, not in acute distress Cardiovascular: Regular rate and rhythm, S1/S2 +, no murmurs, no rubs, no gallops Respiratory: Clear to auscultation bilaterally, no wheezing, no crackles, no rhonchi Abdominal: Soft, non tender, non distended, bowel sounds +, no guarding Extremities: no edema, no cyanosis, pulses palpable bilaterally DP and PT Neuro: Grossly nonfocal  Discharge Instructions  Discharge Instructions    Diet - low sodium heart healthy    Complete by:  As directed      Increase activity slowly    Complete by:  As directed             Medication List    TAKE these medications        aspirin 325 MG EC tablet  Take 1 tablet (325 mg total) by mouth 2 (two) times daily after a meal.     HAIR/SKIN/NAILS PO  Take 1 tablet by mouth daily.     HYDROcodone-acetaminophen 5-325 MG per tablet  Commonly known as:  NORCO/VICODIN  Take 1-2 tablets by mouth every 4 (four) hours as needed for moderate pain.     VITAMIN D-3 PO  Take by mouth.  Follow-up Information    Follow up with [facility].   Why:  A home health agency will be arranged by [facility] and will call you at home to schedule home health physical and occupational therapy.   Contact information:   [phone]      Follow up with [place], ISKRA, MD.   Specialty:  Internal Medicine   Why:  As needed, If symptoms worsen   Contact information:   [address] [place] [address] [phone]        The results of significant diagnostics from this hospitalization (including imaging, microbiology, ancillary and laboratory) are listed below for reference.     Microbiology: Recent Results (from the past 240 hour(s))  Surgical pcr screen     Status: None    Collection Time: 06/26/14  8:32 PM  Result Value Ref Range Status   MRSA, PCR NEGATIVE NEGATIVE Final   Staphylococcus aureus NEGATIVE NEGATIVE Final    Comment:        The Xpert SA Assay (FDA approved for NASAL specimens in patients over 77 years of age), is one component of a comprehensive surveillance program.  Test performance has been validated by [facility] for patients greater than or equal to 77 year old. It is not intended to diagnose infection nor to guide or monitor treatment.      Labs: Basic Metabolic Panel:  Recent Labs Lab 06/26/14 1447 06/27/14 0550 06/28/14 0445 06/29/14 0439  NA 144 139 140 141  K 3.6* 3.4* 4.4 3.6*  CL 102 98 105 102  CO2 28 28 28 30   GLUCOSE 86 79 106* 100*  BUN 12 12 10 11   CREATININE 0.36* 0.30* 0.30* 0.27*  CALCIUM 9.3 9.2 9.0 8.6   CBC:  Recent Labs Lab 06/26/14 1447 06/27/14 0550 06/28/14 0445 06/29/14 0439  WBC 4.4 3.2* 5.0 2.5*  NEUTROABS 3.4  --   --   --   HGB 13.4 13.5 11.1* 11.0*  HCT 39.5 40.1 33.2* 33.4*  MCV 94.5 95.5 95.7 97.1  PLT PLATELET CLUMPS NOTED ON SMEAR, COUNT APPEARS ADEQUATE 122* 95* 96*    SIGNED: Time coordinating discharge: Over 30 minutes  Meghan [place], ISKRA, MD  [facility] 06/29/2014, 6:04 PM Pager [phone]  If 7PM-7AM, please contact night-coverage [email or web address] Password [email or web address]

## 2014-06-30 NOTE — Progress Notes (Signed)
Discharge summary sent to payer through [facility]  

## 2014-09-01 NOTE — Procedures (Signed)
PATIENT:   Jeanette Hamilton, Jeanette Hamilton           DOS:         09/01/2014  MR#:       [id number]                   ACCT#:       [id number]  DOB:       27-May-1935                    LOCATION:  AGE:       79                           Electronically Authenticated                    Asencion IslamJohn A Natthew Marlatt, MD 09/01/2014 03:02 P      PVR ARTERIAL STUDY - LOWER EXTREMITY    CPT: [id number]    SONOGRAPHER:  lhm    HISTORY: This is a 80 year old F who  presents with peripheral  vascular disease (443.9).    FINDINGS:  The patient's ankle-brachial index on the right is 1.1. The  patient's ankle-brachial index on the left is 1.    Digital index on the right is 0.5 and on the left is 0.82.    At rest, segmental waveforms on the right are normal. At rest,  segmental waveforms on the left are normal.  This patient was not exercised.    IMPRESSION: This is a normal study of the right lower extremity. This  is a normal study of the left lower extremity.Waves in both feet are  diminished but all are pulsatile.                                   Asencion IslamJohn A Krystyne Tewksbury, MD    cc:    Bayard BeaverSue E Meyer, MD         [facility]         [address]         [place] [id number]

## 2014-09-01 NOTE — Procedures (Signed)
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        [address]        [place] [id number]

## 2015-09-18 IMAGING — CR DG HIP COMPLETE 2+V*R*
3 series · 3 of 3 positions shown · non-contrast
Comparison: None.

CLINICAL DATA: fell at a hotel prior to arrival to ED today. Pt c/o
Rt hip pain at lateral aspect. Hx hysterectomy

EXAM:
RIGHT HIP - COMPLETE 2+ VIEW

[view not recorded (1 of 3)]
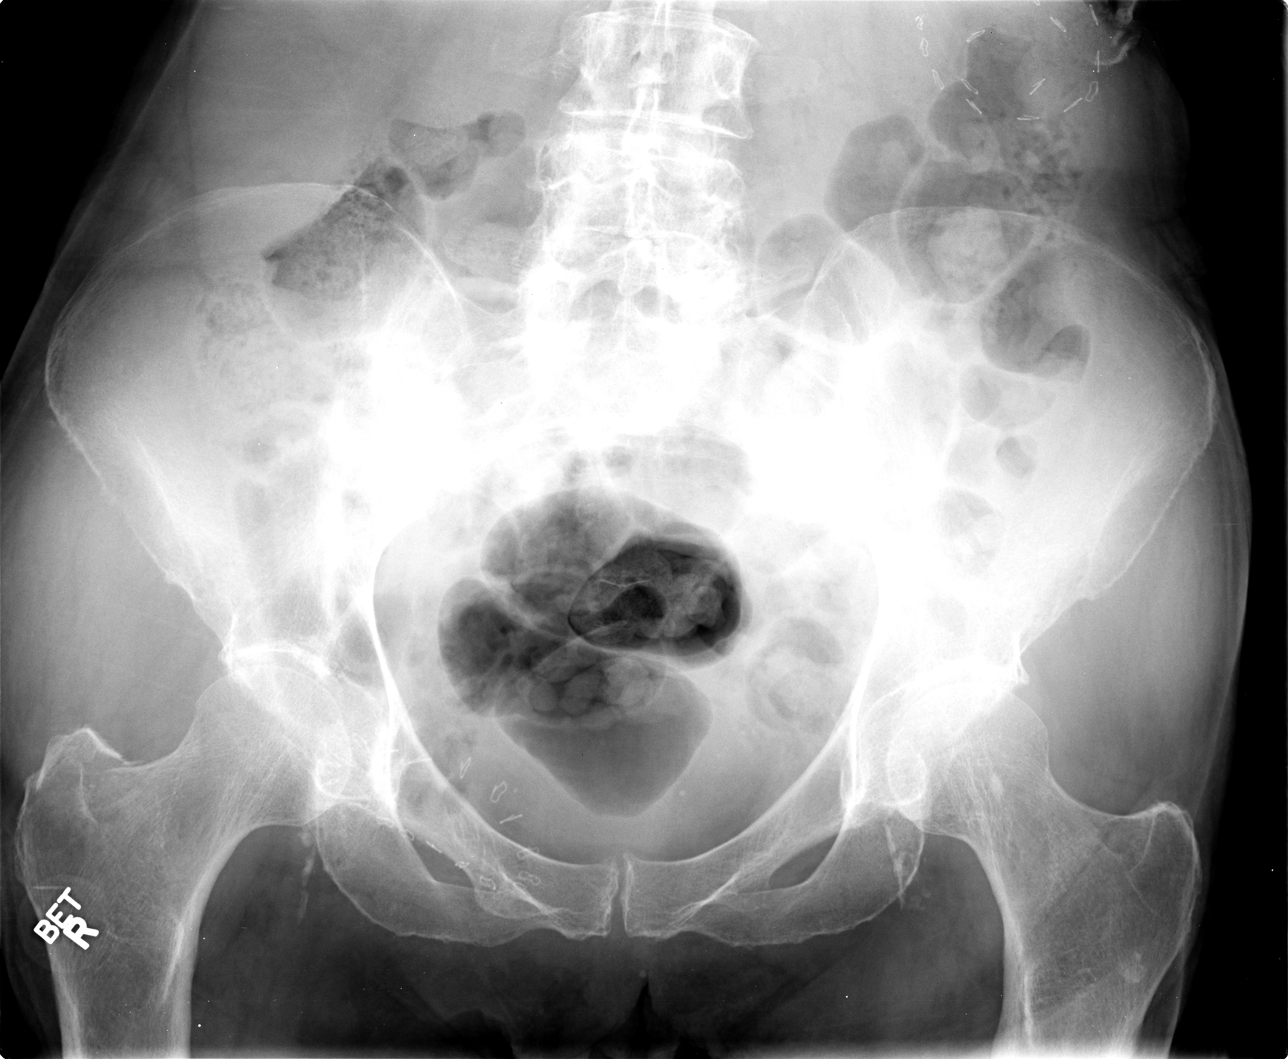

[view not recorded (2 of 3)]
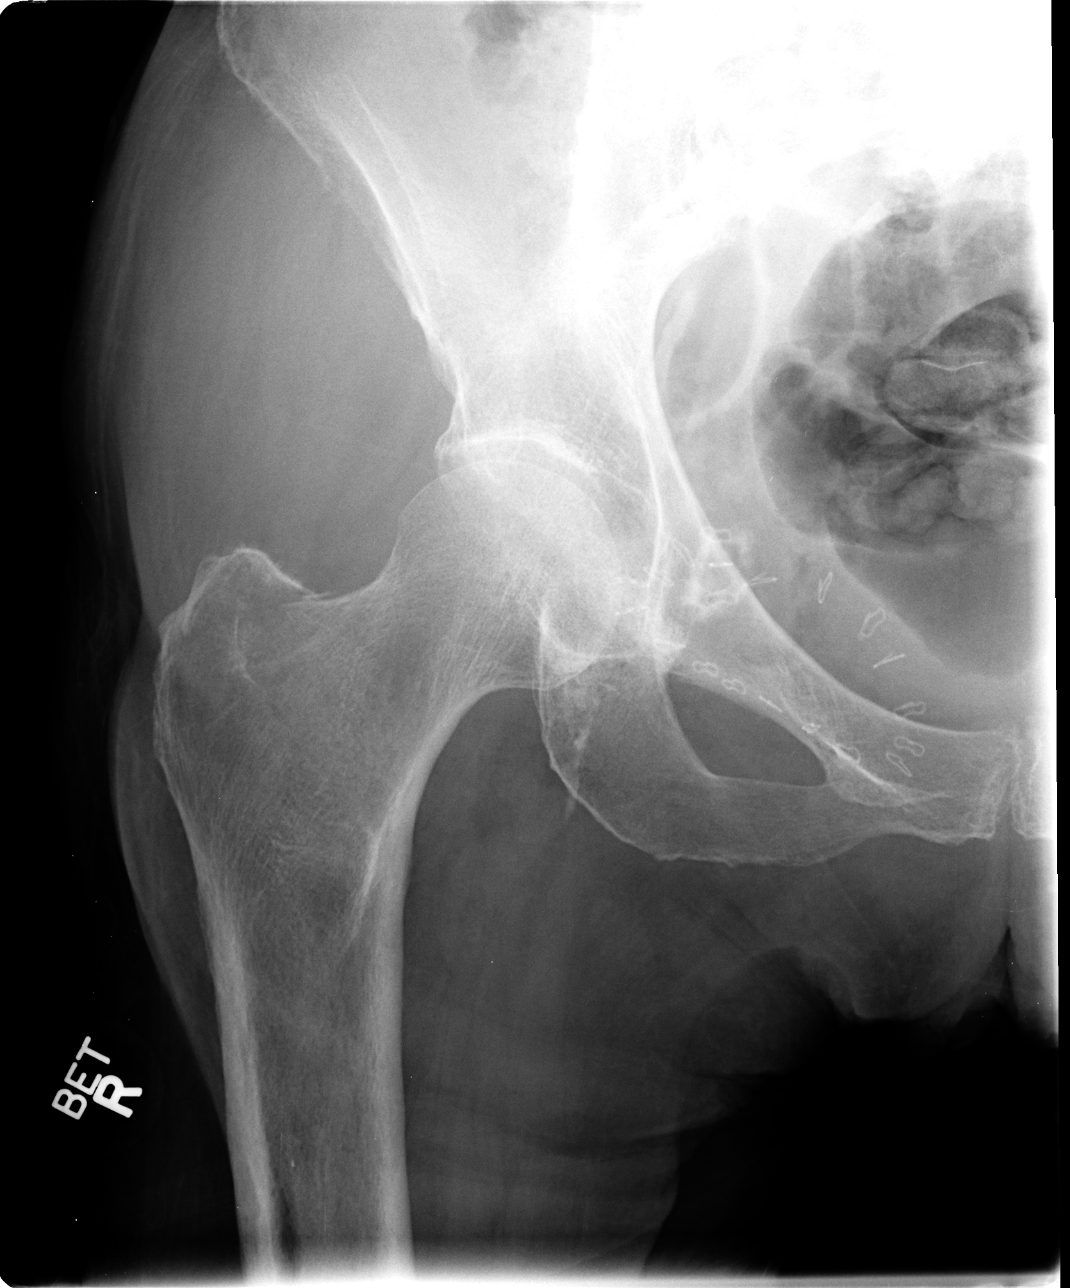

[view not recorded (3 of 3)]
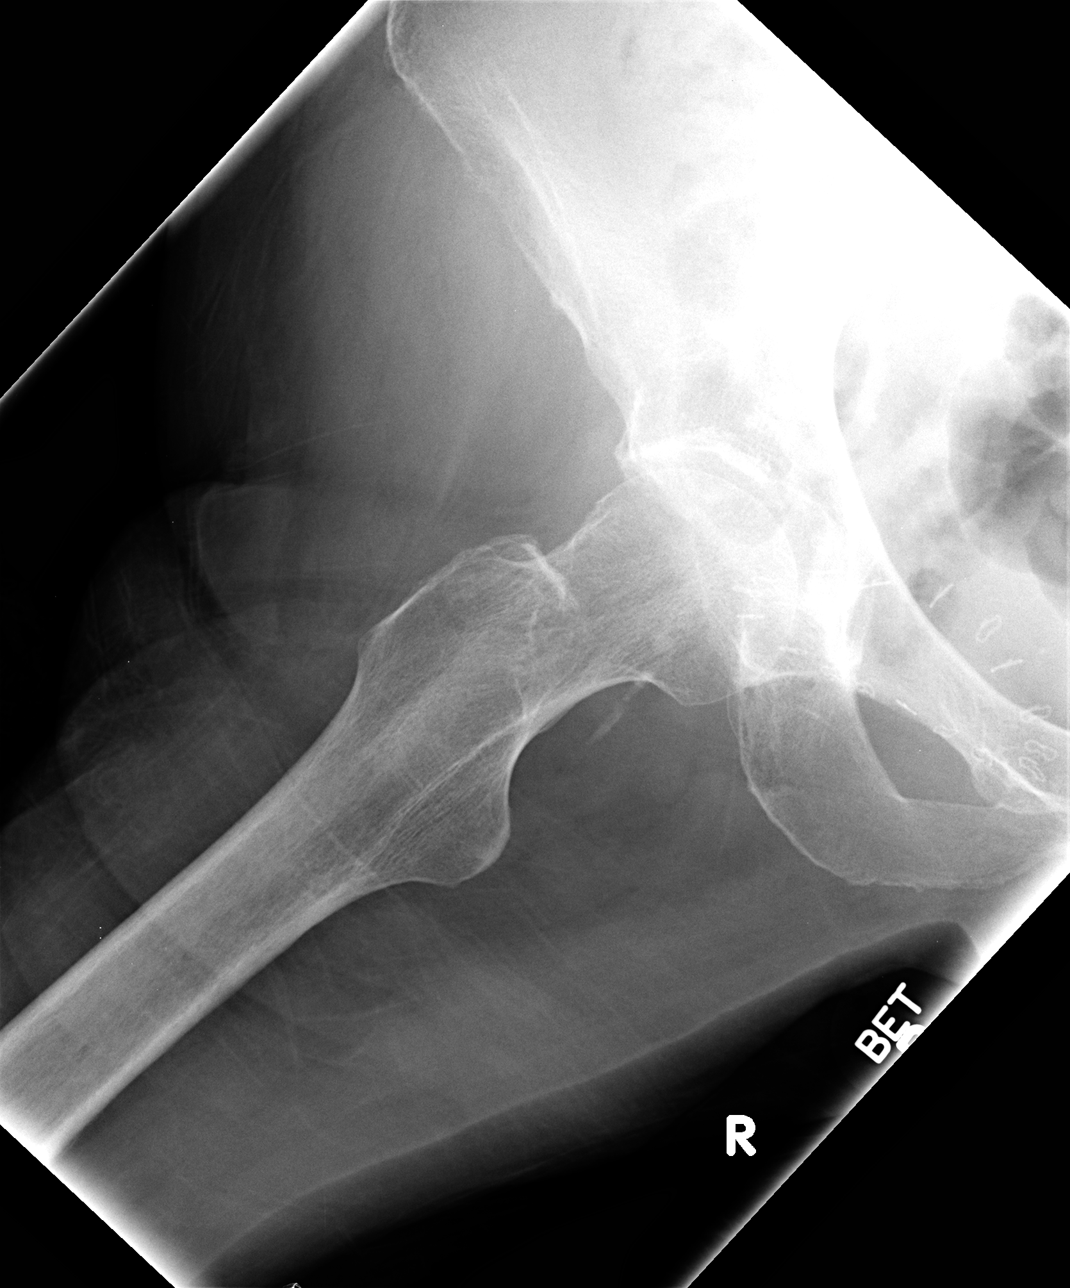

[3 of 3 positions shown; findings below may reference images not displayed]

FINDINGS: There is a nondisplaced basicervical fracture of the right femur,
probably also involving the greater trochanter. No dislocation. No
significant osseous degenerative change. Surgical sutures project
over the right inguinal region and left lower quadrant.
IMPRESSION: 1. Right basicervical hip fracture, nondisplaced.

## 2018-04-15 DEATH — deceased
